# Patient Record
Sex: Female | Born: 1975 | Race: Black or African American | Hispanic: No | Marital: Single | Smoking: Never smoker
Health system: Southern US, Community
[De-identification: ages and names within clinical notes are randomized; demographics above are authoritative.]

## PROBLEM LIST (undated history)

## (undated) DIAGNOSIS — I10 Essential (primary) hypertension: Secondary | ICD-10-CM

## (undated) HISTORY — DX: Essential (primary) hypertension: I10

## (undated) HISTORY — PX: APPENDECTOMY: SHX54

## (undated) HISTORY — PX: CYST EXCISION: SHX5701

## (undated) HISTORY — PX: TONSILECTOMY/ADENOIDECTOMY WITH MYRINGOTOMY: SHX6125

## (undated) HISTORY — PX: ABDOMINAL HYSTERECTOMY: SHX81

---

## 2014-02-23 DIAGNOSIS — I10 Essential (primary) hypertension: Secondary | ICD-10-CM | POA: Insufficient documentation

## 2015-11-01 DIAGNOSIS — D500 Iron deficiency anemia secondary to blood loss (chronic): Secondary | ICD-10-CM | POA: Insufficient documentation

## 2019-09-04 ENCOUNTER — Other Ambulatory Visit: Payer: Self-pay

## 2019-09-04 ENCOUNTER — Ambulatory Visit (INDEPENDENT_AMBULATORY_CARE_PROVIDER_SITE_OTHER): Payer: 59 | Admitting: Bariatrics

## 2019-09-04 ENCOUNTER — Encounter (INDEPENDENT_AMBULATORY_CARE_PROVIDER_SITE_OTHER): Payer: Self-pay | Admitting: Bariatrics

## 2019-09-04 VITALS — BP 133/70 | HR 88 | Temp 98.4°F | Ht 65.0 in | Wt 276.0 lb

## 2019-09-04 DIAGNOSIS — I10 Essential (primary) hypertension: Secondary | ICD-10-CM

## 2019-09-04 DIAGNOSIS — Z9189 Other specified personal risk factors, not elsewhere classified: Secondary | ICD-10-CM

## 2019-09-04 DIAGNOSIS — R0602 Shortness of breath: Secondary | ICD-10-CM | POA: Diagnosis not present

## 2019-09-04 DIAGNOSIS — R5383 Other fatigue: Secondary | ICD-10-CM | POA: Diagnosis not present

## 2019-09-04 DIAGNOSIS — Z1331 Encounter for screening for depression: Secondary | ICD-10-CM | POA: Diagnosis not present

## 2019-09-04 DIAGNOSIS — Z6841 Body Mass Index (BMI) 40.0 and over, adult: Secondary | ICD-10-CM

## 2019-09-04 DIAGNOSIS — D508 Other iron deficiency anemias: Secondary | ICD-10-CM | POA: Diagnosis not present

## 2019-09-04 DIAGNOSIS — E66813 Obesity, class 3: Secondary | ICD-10-CM

## 2019-09-04 DIAGNOSIS — Z0289 Encounter for other administrative examinations: Secondary | ICD-10-CM

## 2019-09-04 NOTE — Progress Notes (Signed)
Chief Complaint:   OBESITY Katherine Arellano (MR# [id number]) is a 44 y.o. female who presents for evaluation and treatment of obesity and related comorbidities. Current BMI is Body mass index is 45.93 kg/m.Katherine Arellano has been struggling with her weight for many years and has been unsuccessful in either losing weight, maintaining weight loss, or reaching her healthy weight goal.  Katherine Arellano is currently in the action stage of change and ready to dedicate time achieving and maintaining a healthier weight. Katherine Arellano is interested in becoming our patient and working on intensive lifestyle modifications including (but not limited to) diet and exercise for weight loss.  Katherine Arellano does sometimes like to cook, but states that she gets into ruts. She craves baked goods. She skips breakfast most days.  Katherine Arellano's habits were reviewed today and are as follows: her desired weight loss is 76-96 lbs, she has been heavy most of her life, she started gaining weight when she started to travel, her heaviest weight ever was 300+ pounds, she craves baked goods, she snacks frequently in the evenings, she skips breakfast most days, she is frequently drinking liquids with calories, she frequently makes poor food choices, she frequently eats larger portions than normal, she has binge eating behaviors and she struggles with emotional eating.  Depression Screen Katherine Arellano's Food and Mood (modified PHQ-9) score was 7.  Depression screen [facility] 2/9 09/04/2019  Decreased Interest 0  Down, Depressed, Hopeless 1  PHQ - 2 Score 1  Altered sleeping 1  Tired, decreased energy 1  Change in appetite 1  Feeling bad or failure about yourself  1  Trouble concentrating 2  Moving slowly or fidgety/restless 0  Suicidal thoughts 0  PHQ-9 Score 7  Difficult doing work/chores Not difficult at all   Subjective:   Other fatigue. Katherine Arellano denies daytime somnolence and denies waking up still tired. Katherine Arellano generally gets 7-8 hours of sleep per night, and states  that she has generally restful sleep. Snoring is present. Apneic episodes are not present. Epworth Sleepiness Score is 2.  SOB (shortness of breath) on exertion. Katherine Arellano notes increasing shortness of breath with certain activities (occasionally stairs) and seems to be worsening over time with weight gain. She notes getting out of breath sooner with activity than she used to. This has not gotten worse recently. Katherine Arellano denies shortness of breath at rest or orthopnea.  Essential hypertension. Katherine Arellano is taking amlodipine. Blood pressure is well controlled.  BP Readings from Last 3 Encounters:  09/04/19 133/70   No results found for: CREATININE  Other iron deficiency anemia. Katherine Arellano is taking iron supplementation.  No flowsheet data found. No results found for: IRON, TIBC, FERRITIN No results found for: VITAMINB12  Depression screening. Katherine Arellano had a mildly positive depression screen with a PHQ-9 score of 7.  At risk for heart disease. Katherine Arellano is at a higher than average risk for cardiovascular disease due to obesity and hypertension. Reviewed: no chest pain on exertion, no dyspnea on exertion, and no swelling of ankles.  Assessment/Plan:   Other fatigue. Katherine Arellano does not feel that her weight is causing her energy to be lower than it should be. Fatigue may be related to obesity, depression or many other causes. Labs will be ordered, and in the meanwhile, [name] will focus on self care including making healthy food choices, increasing physical activity and focusing on stress reduction. EKG 12-Lead, Comprehensive metabolic panel, CBC with Differential/Platelet, Hemoglobin A1c, Insulin, random, Lipid Panel With LDL/HDL Ratio, VITAMIN D 25 Hydroxy (Vit-D Deficiency, Fractures),  TSH, T4, free, T3 labs ordered.  SOB (shortness of breath) on exertion. Katherine Arellano does not feel that she gets out of breath more easily that she used to when she exercises. Katherine Arellano's shortness of breath appears to be obesity related and exercise  induced. She has agreed to work on weight loss and gradually increase exercise to treat her exercise induced shortness of breath. Will continue to monitor closely. Comprehensive metabolic panel, CBC with Differential/Platelet, Hemoglobin A1c, Insulin, random, Lipid Panel With LDL/HDL Ratio, VITAMIN D 25 Hydroxy (Vit-D Deficiency, Fractures), TSH, T4, free, T3 labs ordered.  Essential hypertension. Katherine Arellano is working on healthy weight loss and exercise to improve blood pressure control. We will watch for signs of hypotension as she continues her lifestyle modifications. She will continue her medications as directed.  Other iron deficiency anemia. Orders and follow up as documented in patient record. CBC with Differential/Platelet ordered.  Counseling . Iron is essential for our bodies to make red blood cells.  Reasons that someone may be deficient include: an iron-deficient diet (more likely in those following vegan or vegetarian diets), women with heavy menses, patients with GI disorders or poor absorption, patients that have had bariatric surgery, frequent blood donors, patients with cancer, and patients with heart disease.   Katherine Arellano An iron supplement has been recommended. This is found over-the-counter.  Katherine Arellano foods include dark leafy greens, red and white meats, eggs, seafood, and beans.   . Certain foods and drinks prevent your body from absorbing iron properly. Avoid eating these foods in the same meal as iron-rich foods or with iron supplements. These foods include: coffee, black tea, and red wine; milk, dairy products, and foods that are high in calcium; beans and soybeans; whole grains.  . Constipation can be a side effect of iron supplementation. Increased water and fiber intake are helpful. Water goal: > 2 liters/day. Fiber goal: > 25 grams/day.     Depression screening. Regnia had a positive depression screening. Depression is commonly associated with obesity and often results in emotional  eating behaviors. We will monitor this closely and work on CBT to help improve the non-hunger eating patterns. Referral to Psychology may be required if no improvement is seen as she continues in our clinic.  At risk for heart disease. Myrtis was given approximately 15 minutes of coronary artery disease prevention counseling today. She is 44 y.o. female and has risk factors for heart disease including obesity. We discussed intensive lifestyle modifications today with an emphasis on specific weight loss instructions and strategies.   Repetitive spaced learning was employed today to elicit superior memory formation and behavioral change.  Class 3 severe obesity with serious comorbidity and body mass index (BMI) of 45.0 to 49.9 in adult, unspecified obesity type ([place]).  Shenai is currently in the action stage of change and her goal is to continue with weight loss efforts. I recommend Jaqueline begin the structured treatment plan as follows:  She has agreed to the Category 3 Plan.  She will work on meal planning, cut out sugary drinks, and decrease portion sizes.  Exercise goals: All adults should avoid inactivity. Some physical activity is better than none, and adults who participate in any amount of physical activity gain some health benefits.   Behavioral modification strategies: increasing lean protein intake, decreasing simple carbohydrates, increasing vegetables, increasing water intake, decreasing eating out, no skipping meals, meal planning and cooking strategies, keeping healthy foods in the home and planning for success.  She was informed of the importance  of frequent follow-up visits to maximize her success with intensive lifestyle modifications for her multiple health conditions. She was informed we would discuss her lab results at her next visit unless there is a critical issue that needs to be addressed sooner. Vienne agreed to keep her next visit at the agreed upon time to discuss these  results.  Objective:   Blood pressure 133/70, pulse 88, temperature 98.4 F (36.9 C), height 5\' 5"  (1.651 m), weight 276 lb (125.2 kg), last menstrual period 09/03/2019, SpO2 100 %. Body mass index is 45.93 kg/m.  EKG: Normal sinus rhythm, rate 82 BPM.  Indirect Calorimeter completed today shows a VO2 of 262 and a REE of 1826.  Her calculated basal metabolic rate is [id number] thus her basal metabolic rate is worse than expected.  General: Cooperative, alert, well developed, in no acute distress. HEENT: Conjunctivae and lids unremarkable. Cardiovascular: Regular rhythm.  Lungs: Normal work of breathing. Neurologic: No focal deficits.   No results found for: CREATININE, BUN, NA, K, CL, CO2 No results found for: ALT, AST, GGT, ALKPHOS, BILITOT No results found for: HGBA1C No results found for: INSULIN No results found for: TSH No results found for: CHOL, HDL, LDLCALC, LDLDIRECT, TRIG, CHOLHDL No results found for: WBC, HGB, HCT, MCV, PLT No results found for: IRON, TIBC, FERRITIN  Attestation Statements:   Reviewed by clinician on day of visit: allergies, medications, problem list, medical history, surgical history, family history, social history, and previous encounter notes.  [name], am acting as Location manager for [employer], DO   I have reviewed the above documentation for accuracy and completeness, and I agree with the above. Jearld Lesch, DO

## 2019-09-05 LAB — COMPREHENSIVE METABOLIC PANEL
ALT: 10 IU/L (ref 0–32)
AST: 16 IU/L (ref 0–40)
Albumin/Globulin Ratio: 1.6 (ref 1.2–2.2)
Albumin: 4.4 g/dL (ref 3.8–4.8)
Alkaline Phosphatase: 53 IU/L (ref 39–117)
BUN/Creatinine Ratio: 10 (ref 9–23)
BUN: 9 mg/dL (ref 6–24)
Bilirubin Total: 0.3 mg/dL (ref 0.0–1.2)
CO2: 26 mmol/L (ref 20–29)
Calcium: 9 mg/dL (ref 8.7–10.2)
Chloride: 102 mmol/L (ref 96–106)
Creatinine, Ser: 0.94 mg/dL (ref 0.57–1.00)
GFR calc Af Amer: 85 mL/min/{1.73_m2} (ref >59)
GFR calc non Af Amer: 74 mL/min/{1.73_m2} (ref >59)
Globulin, Total: 2.8 g/dL (ref 1.5–4.5)
Glucose: 79 mg/dL (ref 65–99)
Potassium: 4 mmol/L (ref 3.5–5.2)
Sodium: 139 mmol/L (ref 134–144)
Total Protein: 7.2 g/dL (ref 6.0–8.5)

## 2019-09-05 LAB — CBC WITH DIFFERENTIAL/PLATELET
Basophils Absolute: 0.1 10*3/uL (ref 0.0–0.2)
Basos: 2 %
EOS (ABSOLUTE): 0.1 10*3/uL (ref 0.0–0.4)
Eos: 1 %
Hematocrit: 37.6 % (ref 34.0–46.6)
Hemoglobin: 12.8 g/dL (ref 11.1–15.9)
Immature Grans (Abs): 0 10*3/uL (ref 0.0–0.1)
Immature Granulocytes: 0 %
Lymphocytes Absolute: 1.5 10*3/uL (ref 0.7–3.1)
Lymphs: 30 %
MCH: 30.3 pg (ref 26.6–33.0)
MCHC: 34 g/dL (ref 31.5–35.7)
MCV: 89 fL (ref 79–97)
Monocytes Absolute: 0.3 10*3/uL (ref 0.1–0.9)
Monocytes: 7 %
Neutrophils Absolute: 3 10*3/uL (ref 1.4–7.0)
Neutrophils: 60 %
Platelets: 353 10*3/uL (ref 150–450)
RBC: 4.22 x10E6/uL (ref 3.77–5.28)
RDW: 11.9 % (ref 11.7–15.4)
WBC: 4.9 10*3/uL (ref 3.4–10.8)

## 2019-09-05 LAB — LIPID PANEL WITH LDL/HDL RATIO
Cholesterol, Total: 169 mg/dL (ref 100–199)
HDL: 45 mg/dL (ref >39)
LDL Chol Calc (NIH): 112 mg/dL — ABNORMAL HIGH (ref 0–99)
LDL/HDL Ratio: 2.5 ratio (ref 0.0–3.2)
Triglycerides: 61 mg/dL (ref 0–149)
VLDL Cholesterol Cal: 12 mg/dL (ref 5–40)

## 2019-09-05 LAB — VITAMIN D 25 HYDROXY (VIT D DEFICIENCY, FRACTURES): Vit D, 25-Hydroxy: 30.4 ng/mL (ref 30.0–100.0)

## 2019-09-05 LAB — T3: T3, Total: 116 ng/dL (ref 71–180)

## 2019-09-05 LAB — T4, FREE: Free T4: 1.43 ng/dL (ref 0.82–1.77)

## 2019-09-05 LAB — INSULIN, RANDOM: INSULIN: 7.3 u[IU]/mL (ref 2.6–24.9)

## 2019-09-05 LAB — HEMOGLOBIN A1C
Est. average glucose Bld gHb Est-mCnc: 85 mg/dL
Hgb A1c MFr Bld: 4.6 % — ABNORMAL LOW (ref 4.8–5.6)

## 2019-09-05 LAB — TSH: TSH: 1.08 u[IU]/mL (ref 0.450–4.500)

## 2019-09-18 ENCOUNTER — Encounter (INDEPENDENT_AMBULATORY_CARE_PROVIDER_SITE_OTHER): Payer: Self-pay | Admitting: Bariatrics

## 2019-09-18 ENCOUNTER — Ambulatory Visit (INDEPENDENT_AMBULATORY_CARE_PROVIDER_SITE_OTHER): Payer: 59 | Admitting: Bariatrics

## 2019-09-18 ENCOUNTER — Other Ambulatory Visit: Payer: Self-pay

## 2019-09-18 VITALS — BP 126/57 | HR 104 | Temp 98.7°F | Ht 65.0 in | Wt 273.0 lb

## 2019-09-18 DIAGNOSIS — I10 Essential (primary) hypertension: Secondary | ICD-10-CM | POA: Diagnosis not present

## 2019-09-18 DIAGNOSIS — F3289 Other specified depressive episodes: Secondary | ICD-10-CM

## 2019-09-18 DIAGNOSIS — Z9189 Other specified personal risk factors, not elsewhere classified: Secondary | ICD-10-CM | POA: Diagnosis not present

## 2019-09-18 DIAGNOSIS — E559 Vitamin D deficiency, unspecified: Secondary | ICD-10-CM | POA: Diagnosis not present

## 2019-09-18 DIAGNOSIS — Z6841 Body Mass Index (BMI) 40.0 and over, adult: Secondary | ICD-10-CM

## 2019-09-18 MED ORDER — VITAMIN D (ERGOCALCIFEROL) 1.25 MG (50000 UNIT) PO CAPS: 50000.0000 [IU] | ORAL_CAPSULE | ORAL | 0 refills | Status: DC

## 2019-09-18 NOTE — Progress Notes (Signed)
Office: [phone]  /  Fax: [phone]    Date: September 28, 2019   Appointment Start Time: 3:00pm Duration: 40 minutes Provider: Glennie Isle, Psy.D. Type of Session: Intake for Individual Therapy  Location of Patient: Home Location of Provider: Provider's Home Type of Contact: Telepsychological Visit via [employer]  Informed Consent: Prior to proceeding with today's appointment, two pieces of identifying information were obtained. In addition, Jasnoor's physical location at the time of this appointment was obtained as well a phone number she could be reached at in the event of technical difficulties. Diane and this provider participated in today's telepsychological service.   The provider's role was explained to [facility]. The provider reviewed and discussed issues of confidentiality, privacy, and limits therein (e.g., reporting obligations). In addition to verbal informed consent, written informed consent for psychological services was obtained prior to the initial appointment. Since the clinic is not a 24/7 crisis center, mental health emergency resources were shared and this  provider explained MyChart, e-mail, voicemail, and/or other messaging systems should be utilized only for non-emergency reasons. This provider also explained that information obtained during appointments will be placed in Avriel's medical record and relevant information will be shared with other providers at [facility] for coordination of care. Moreover, Laisha agreed information may be shared with other [facility] providers as needed for coordination of care. By signing the service agreement document, Destyn provided written consent for coordination of care. Prior to initiating telepsychological services, Shanae completed an informed consent document, which included the development of a safety plan (i.e., an emergency contact, nearest emergency room, and emergency resources) in the event of an  emergency/crisis. Labrea expressed understanding of the rationale of the safety plan. Stephie verbally acknowledged understanding she is ultimately responsible for understanding her insurance benefits for telepsychological and in-person services. This provider also reviewed confidentiality, as it relates to telepsychological services, as well as the rationale for telepsychological services (i.e., to reduce exposure risk to COVID-19). Tajai  acknowledged understanding that appointments cannot be recorded without both party consent and she is aware she is responsible for securing confidentiality on her end of the session. Takayla verbally consented to proceed.  Chief Complaint/HPI: Ileen was referred by Dr. Jearld Lesch due to other depression, with emotional eating. Per the note for the visit with Dr. Jearld Lesch on September 18, 2019, "Kryslyn is struggling with emotional eating and using food for comfort to the extent that it is negatively impacting her health. She has been working on behavior modification techniques to help reduce her emotional eating and has been somewhat successful. She shows no sign of suicidal or homicidal ideations. Annalie reports more grazing and reward eating." The note for the initial appointment with Dr. Jearld Lesch (September 04, 2019) indicated the following: "Perla's habits were reviewed today and are as follows: her desired weight loss is 76-96 lbs, she has been heavy most of her life, she started gaining weight when she started to travel, her heaviest weight ever was 300+ pounds, she craves baked goods, she snacks frequently in the evenings, she skips breakfast most days, she is frequently drinking liquids with calories, she frequently makes poor food choices, she frequently eats larger portions than normal, she has binge eating behaviors and she struggles with emotional eating." Malaney's Food and Mood (modified PHQ-9) score on September 04, 2019 was 7.  During today's appointment, Khamryn was verbally  administered a questionnaire assessing various behaviors related to emotional eating. Kensli endorsed the following:  overeat when you are celebrating, experience food cravings on a regular basis, eat certain foods when you are anxious, stressed, depressed, or your feelings are hurt, find food is comforting to you, overeat when you are worried about something, overeat frequently when you are bored or lonely, overeat when you are alone, but eat much less when you are with other people and eat as a reward. She shared she craves baked goods and homemade foods. Karter believes the onset of emotional eating was likely during childhood and described the current frequency of emotional eating as "few times a month." She added, "I've gotten better as I've aged." Ciarah further shared she engages in mindless eating. In addition, Adora denied a history of binge eating. However, she described engaging in grazing behaviors. Brenee denied a history of restricting food intake, purging and engagement in other compensatory strategies, and has never been diagnosed with an eating disorder. She also denied a history of treatment for emotional eating. Moreover, Jaqueline indicated stress and a desire to reward self due to stress triggers emotional eating, whereas staying busy and occupied makes emotional eating better. Furthermore, Sharlyne reported ongoing work stressors, noting she worries if she will be let go.    Mental Status Examination:  Appearance: well groomed and appropriate hygiene  Behavior: appropriate to circumstances Mood: euthymic Affect: mood congruent Speech: normal in rate, volume, and tone Eye Contact: appropriate Psychomotor Activity: appropriate Gait: unable to assess Thought Process: linear, logical, and goal directed  Thought Content/Perception: denies suicidal and homicidal ideation, plan, and intent and no hallucinations, delusions, bizarre thinking or behavior reported or observed Orientation: time, person,  place and purpose of appointment Memory/Concentration: memory, attention, language, and fund of knowledge intact  Insight/Judgment: good  Family & Psychosocial History: Roseanna reported she is not in a relationship and she does not have any children. 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 Additionally, Marvette shared her highest level of education obtained is a bachelor's degree. Currently, Charne's social support system consists of her best friend and parents. Moreover, Iyani stated she resides alone.  Medical History:  Past Medical History:  Diagnosis Date  . High blood pressure    Past Surgical History:  Procedure Laterality Date  . TONSILECTOMY/ADENOIDECTOMY WITH MYRINGOTOMY     Current Outpatient Medications on File Prior to Visit  Medication Sig Dispense Refill  . amLODipine (NORVASC) 10 MG tablet Take 10 mg by mouth daily.    . Biotin 1 MG CAPS Take by mouth.    . ferrous sulfate 325 (65 FE) MG tablet Take 325 mg by mouth daily with breakfast.    . Multiple Vitamin (MULTI-VITAMIN DAILY PO) Take by mouth.    . Vitamin D, Ergocalciferol, (DRISDOL) 1.25 MG (50000 UNIT) CAPS capsule Take 1 capsule (50,000 Units total) by mouth every 7 (seven) days. 4 capsule 0   No current facility-administered medications on file prior to visit.  Arya denied a history of head injuries and loss of consciousness.    Mental Health History: Edia reported there is no history of therapeutic services. Calea reported there is no history of hospitalizations for psychiatric concerns, and she has never met with a psychiatrist. Jericho stated she has never been prescribed psychotropic medications. Kaedyn denied a family history of mental health related concerns. Kamarii reported there is no history of trauma including psychological, physical  and sexual abuse, as well as neglect.   Mio described her typical mood lately as "okay." Aside from concerns noted above and endorsed  on the PHQ-9 and  GAD-7, Glenn reported experiencing worry about her father's health and work as well as crying spells. Maizee endorsed occasional social alcohol use.  She denied tobacco use. She denied illicit/recreational substance use. Regarding caffeine intake, Melonie reported consuming 3-4 cups of coffee daily. Furthermore, Thersia indicated she is not experiencing the following: hallucinations and delusions, paranoia, symptoms of mania , social withdrawal, panic attacks and decreased motivation. She also denied current suicidal ideation, plan, and intent; history of and current homicidal ideation, plan, and intent; and history of and current engagement in self-harm.  Jaylean reported a history of suicidal ideation (e.g., I wish I wasn't here.) secondary to bullying in 7th grade. She denied a history of suicidal plan and intent. Shamyah reported she last experienced suicidal ideation around junior year of high school. The following protective factors were identified for Marlia: travel, experience every day, and desire for new experiences. If she were to become overwhelmed in the future, which is a sign that a crisis may occur, she identified the following coping skills she could engage in: remove self from situation, read, watch a movie, and talk to friends. It was recommended the aforementioned be written down and developed into a coping card for future reference; she was observed writing. Psychoeducation regarding the importance of reaching out to a trusted individual and/or utilizing emergency resources if there is a change in emotional status and/or there is an inability to ensure safety was provided. Lyndell's confidence in reaching out to a trusted individual and/or utilizing emergency resources should there be an intensification in emotional status and/or there is an inability to ensure safety was assessed on a scale of one to ten where one is not confident and ten is extremely confident. She reported her confidence is a 10.  Additionally, Marliss reported she has access to a pistol, noting it is locked. She was agreeable to giving her pistol to a trusted individual should there ever be any safety concerns.  The following strengths were reported by [facility]: patient with others, empathy, and willingness to listen to others. The following strengths were observed by this provider: ability to express thoughts and feelings during the therapeutic session, ability to establish and benefit from a therapeutic relationship, willingness to work toward established goal(s) with the clinic and ability to engage in reciprocal conversation.  Legal History: Amaziah reported there is no history of legal involvement.   Structured Assessments Results: The Patient Health Questionnaire-9 (PHQ-9) is a self-report measure that assesses symptoms and severity of depression over the course of the last two weeks. Cydney obtained a score of 3 suggesting minimal depression. Clydine finds the endorsed symptoms to be not difficult at all. [0= Not at all; 1= Several days; 2= More than half the days; 3= Nearly every day] Little interest or pleasure in doing things 0  Feeling down, depressed, or hopeless 0  Trouble falling or staying asleep, or sleeping too much 0  Feeling tired or having little energy 2  Poor appetite or overeating 1  Feeling bad about yourself --- or that you are a failure or have let yourself or your family down 0  Trouble concentrating on things, such as reading the newspaper or watching television 0  Moving or speaking so slowly that other people could have noticed? Or the opposite --- being so fidgety or restless that you have been moving around a lot more than usual 0  Thoughts that you would be better off dead or hurting yourself in some way  0  PHQ-9 Score 3    The Generalized Anxiety Disorder-7 (GAD-7) is a brief self-report measure that assesses symptoms of anxiety over the course of the last two weeks. Aleathia obtained a score of 4  suggesting minimal anxiety. Delpha finds the endorsed symptoms to be somewhat difficult. [0= Not at all; 1= Several days; 2= Over half the days; 3= Nearly every day] Feeling nervous, anxious, on edge 1  Not being able to stop or control worrying 1  Worrying too much about different things 0  Trouble relaxing 0  Being so restless that it's hard to sit still 0  Becoming easily annoyed or irritable 0  Feeling afraid as if something awful might happen 2  GAD-7 Score 4   Interventions:  Conducted a chart review Focused on rapport building Verbally administered PHQ-9 and GAD-7 for symptom monitoring Verbally administered Food & Mood questionnaire to assess various behaviors related to emotional eating. Provided emphatic reflections and validation Collaborated with patient on a treatment goal  Psychoeducation provided regarding physical versus emotional hunger Conducted a risk assessment Developed a coping card  Provisional DSM-5 Diagnosis(es): 307.59 (F50.8) Other Specified Feeding or Eating Disorder, Emotional Eating Behaviors  Plan: Karna appears able and willing to participate as evidenced by collaboration on a treatment goal, engagement in reciprocal conversation, and asking questions as needed for clarification. Based on appointment availability and Rudene's work schedule, the next appointment will be scheduled in 2-3 weeks, which will be via [place] Visit. The following treatment goal was established: increase coping skills. This provider will regularly review the treatment plan and medical chart to keep informed of status changes. Laquanta expressed understanding and agreement with the initial treatment plan of care. Cumi will be sent a handout via e-mail to utilize between now and the next appointment to increase awareness of hunger patterns and subsequent eating. Adianna provided verbal consent during today's appointment for this provider to send the handout via e-mail.

## 2019-09-19 ENCOUNTER — Encounter (INDEPENDENT_AMBULATORY_CARE_PROVIDER_SITE_OTHER): Payer: Self-pay | Admitting: Bariatrics

## 2019-09-19 NOTE — Progress Notes (Signed)
Chief Complaint:   OBESITY Katherine Arellano is here to discuss her progress with her obesity treatment plan along with follow-up of her obesity related diagnoses. Katherine Arellano is on the Category 3 Plan and states she is following her eating plan approximately 80% of the time. Katherine Arellano states she is doing cardio/strengthening 30 minutes 3 times per week and walking 10,000 steps 6 times per week.  Today's visit was #: 2 Starting weight: 276 lbs Starting date: 09/04/2019 Today's weight: 273 lbs Today's date: 09/18/2019 Total lbs lost to date: 3 Total lbs lost since last in-office visit: 3  Interim History: Katherine Arellano is down 3 lbs from her last visit. She has been under more stress with her father being sick. She has stopped drinking soda.  Subjective:   Vitamin D deficiency. Katherine Arellano is not on Vitamin D supplement. Last Vitamin D 30.4 on 09/04/2019.  Essential hypertension. Katherine Arellano is taking Norvasc. Blood pressure is well controlled.  BP Readings from Last 3 Encounters:  09/18/19 (!) 126/57  09/04/19 133/70   Lab Results  Component Value Date   CREATININE 0.94 09/04/2019   Other depression, with emotional eating. Katherine Arellano is struggling with emotional eating and using food for comfort to the extent that it is negatively impacting her health. She has been working on behavior modification techniques to help reduce her emotional eating and has been somewhat successful. She shows no sign of suicidal or homicidal ideations. Katherine Arellano reports more grazing and reward eating.  At risk for osteoporosis. Katherine Arellano is at higher risk of osteopenia and osteoporosis due to Vitamin D deficiency.    Assessment/Plan:   Vitamin D deficiency. Low Vitamin D level contributes to fatigue and are associated with obesity, breast, and colon cancer. She was given a prescription for Vitamin D, Ergocalciferol, (DRISDOL) 1.25 MG (50000 UNIT) CAPS capsule every week #4 with 0 refills and will follow-up for routine testing of Vitamin D, at  least 2-3 times per year to avoid over-replacement.    Essential hypertension. Katherine Arellano is working on healthy weight loss and exercise to improve blood pressure control. We will watch for signs of hypotension as she continues her lifestyle modifications. She will continue medications as directed.  Other depression, with emotional eating. Behavior modification techniques were discussed today to help Katherine Arellano deal with her emotional/non-hunger eating behaviors.  Orders and follow up as documented in patient record. Will refer to Dr. Mallie Mussel, our bariatric psychologist, for evaluation.  At risk for osteoporosis. Katherine Arellano was given approximately 15 minutes of osteoporosis prevention counseling today. Katherine Arellano is at risk for osteopenia and osteoporosis due to her Vitamin D deficiency. She was encouraged to take her Vitamin D and follow her higher calcium diet and increase strengthening exercise to help strengthen her bones and decrease her risk of osteopenia and osteoporosis.  Repetitive spaced learning was employed today to elicit superior memory formation and behavioral change.  Class 3 severe obesity with serious comorbidity and body mass index (BMI) of 45.0 to 49.9 in adult, unspecified obesity type (Katherine Arellano).  Katherine Arellano is currently in the action stage of change. As such, her goal is to continue with weight loss efforts. She has agreed to the Category 3 Plan.   She will work on meal planning. Handout was given on "Eating Out."  We reviewed labs with the patient including CMP, lipids, Vitamin D, CBC, A1c, insulin, and thyroid panel.  Exercise goals: For substantial health benefits, adults should do at least 150 minutes (2 hours and 30 minutes) a week of moderate-intensity,  or 75 minutes (1 hour and 15 minutes) a week of vigorous-intensity aerobic physical activity, or an equivalent combination of moderate- and vigorous-intensity aerobic activity. Aerobic activity should be performed in episodes of at least 10 minutes,  and preferably, it should be spread throughout the week.  Behavioral modification strategies: increasing lean protein intake, decreasing simple carbohydrates, increasing vegetables, increasing water intake, decreasing eating out, no skipping meals, meal planning and cooking strategies, keeping healthy foods in the home, ways to avoid boredom eating, ways to avoid night time snacking, better snacking choices, emotional eating strategies and planning for success.  Katherine Arellano has agreed to follow-up with our clinic in 2 weeks. She was informed of the importance of frequent follow-up visits to maximize her success with intensive lifestyle modifications for her multiple health conditions.   Objective:   Blood pressure (!) 126/57, pulse (!) 104, temperature 98.7 F (37.1 C), height 5\' 5"  (1.651 m), weight 273 lb (123.8 kg), last menstrual period 09/03/2019, SpO2 99 %. Body mass index is 45.43 kg/m.  General: Cooperative, alert, well developed, in no acute distress. HEENT: Conjunctivae and lids unremarkable. Cardiovascular: Regular rhythm.  Lungs: Normal work of breathing. Neurologic: No focal deficits.   Lab Results  Component Value Date   CREATININE 0.94 09/04/2019   BUN 9 09/04/2019   NA 139 09/04/2019   K 4.0 09/04/2019   CL 102 09/04/2019   CO2 26 09/04/2019   Lab Results  Component Value Date   ALT 10 09/04/2019   AST 16 09/04/2019   ALKPHOS 53 09/04/2019   BILITOT 0.3 09/04/2019   Lab Results  Component Value Date   HGBA1C 4.6 (L) 09/04/2019   Lab Results  Component Value Date   INSULIN 7.3 09/04/2019   Lab Results  Component Value Date   TSH 1.080 09/04/2019   Lab Results  Component Value Date   CHOL 169 09/04/2019   HDL 45 09/04/2019   LDLCALC 112 (H) 09/04/2019   TRIG 61 09/04/2019   Lab Results  Component Value Date   WBC 4.9 09/04/2019   HGB 12.8 09/04/2019   HCT 37.6 09/04/2019   MCV 89 09/04/2019   PLT 353 09/04/2019   No results found for: IRON, TIBC,  FERRITIN  Attestation Statements:   Reviewed by clinician on day of visit: allergies, medications, problem list, medical history, surgical history, family history, social history, and previous encounter notes.  [name], am acting as Location manager for [employer], DO   I have reviewed the above documentation for accuracy and completeness, and I agree with the above. Jearld Lesch, DO

## 2019-09-28 ENCOUNTER — Other Ambulatory Visit: Payer: Self-pay

## 2019-09-28 ENCOUNTER — Ambulatory Visit (INDEPENDENT_AMBULATORY_CARE_PROVIDER_SITE_OTHER): Payer: 59 | Admitting: Psychology

## 2019-09-28 DIAGNOSIS — F5089 Other specified eating disorder: Secondary | ICD-10-CM | POA: Diagnosis not present

## 2019-10-03 ENCOUNTER — Ambulatory Visit (INDEPENDENT_AMBULATORY_CARE_PROVIDER_SITE_OTHER): Payer: 59 | Admitting: Bariatrics

## 2019-10-04 NOTE — Progress Notes (Signed)
Entered in error

## 2019-10-11 ENCOUNTER — Other Ambulatory Visit: Payer: Self-pay

## 2019-10-11 ENCOUNTER — Ambulatory Visit (INDEPENDENT_AMBULATORY_CARE_PROVIDER_SITE_OTHER): Payer: 59 | Admitting: Bariatrics

## 2019-10-11 ENCOUNTER — Encounter (INDEPENDENT_AMBULATORY_CARE_PROVIDER_SITE_OTHER): Payer: Self-pay | Admitting: Bariatrics

## 2019-10-11 VITALS — BP 127/81 | HR 83 | Temp 98.2°F | Ht 65.0 in | Wt 273.0 lb

## 2019-10-11 DIAGNOSIS — F3289 Other specified depressive episodes: Secondary | ICD-10-CM | POA: Diagnosis not present

## 2019-10-11 DIAGNOSIS — E559 Vitamin D deficiency, unspecified: Secondary | ICD-10-CM | POA: Diagnosis not present

## 2019-10-11 DIAGNOSIS — Z6841 Body Mass Index (BMI) 40.0 and over, adult: Secondary | ICD-10-CM

## 2019-10-11 DIAGNOSIS — I10 Essential (primary) hypertension: Secondary | ICD-10-CM | POA: Diagnosis not present

## 2019-10-11 MED ORDER — VITAMIN D (ERGOCALCIFEROL) 1.25 MG (50000 UNIT) PO CAPS: 50000.0000 [IU] | ORAL_CAPSULE | ORAL | 0 refills | Status: DC

## 2019-10-11 NOTE — Progress Notes (Signed)
Chief Complaint:   OBESITY Katherine Arellano is here to discuss her progress with her obesity treatment plan along with follow-up of her obesity related diagnoses. [name] is on the Category 3 Plan and states she is following her eating plan approximately 30% of the time. [name] states she is walking 30 minutes 1 time per week.  Today's visit was #: 3 Starting weight: 276 lbs Starting date: 09/04/2019 Today's weight: 273 lbs Today's date: 10/11/2019 Total lbs lost to date: 3 Total lbs lost since last in-office visit: 0  Interim History: Katherine Arellano has been traveling for work the last two weeks to [place], [place]. She ate out more frequently and was unable to exercise at her typical level while traveling. She has an upcoming trip to visit her parents and is planning on having food delivered to her parents' house to help stay on plan.  Subjective:   Vitamin D deficiency. Last Vitamin D 30.4 on 09/04/2019. Katherine Arellano is on prescription strength Vitamin D supplementation.  Essential hypertension. Blood pressure is well controlled at today's office visit. She is currently on amlodipine 10 mg daily. She denies lower extremity edema.  BP Readings from Last 3 Encounters:  10/11/19 127/81  09/18/19 (!) 126/57  09/04/19 133/70   Lab Results  Component Value Date   CREATININE 0.94 09/04/2019   Other depression, with emotional eating. Andrea is struggling with emotional eating and using food for comfort to the extent that it is negatively impacting her health. She has been working on behavior modification techniques to help reduce her emotional eating and has been somewhat successful. She shows no sign of suicidal or homicidal ideations. Katherine Arellano has been seeing Dr. Mallie Mussel and discussing strategies to identify true hunger versus emotional eating/reward eating.  Assessment/Plan:   Vitamin D deficiency. Low Vitamin D level contributes to fatigue and are associated with obesity, breast, and colon cancer. She  was given a refill on her Vitamin D, Ergocalciferol, (DRISDOL) 1.25 MG (50000 UNIT) CAPS capsule every week #4 with 0 refills and will follow-up for routine testing of Vitamin D, at least 2-3 times per year to avoid over-replacement.     Essential hypertension. Katherine Arellano is working on healthy weight loss and exercise to improve blood pressure control. We will watch for signs of hypotension as she continues her lifestyle modifications. [name] will continue her current CCB and will continue the Category 3 meal plan.  Other depression, with emotional eating. Behavior modification techniques were discussed today to help Katherine Arellano deal with her emotional/non-hunger eating behaviors.  Orders and follow up as documented in patient record. [name] will continue with Dr. Mallie Mussel as directed.  Class 3 severe obesity with serious comorbidity and body mass index (BMI) of 45.0 to 49.9 in adult, unspecified obesity type ([place]).  Katherine Arellano is currently in the action stage of change. As such, her goal is to continue with weight loss efforts. She has agreed to the Category 3 Plan. We provided protein equivalence information sheet.  Exercise goals: Katherine Arellano will continue her current exercise regimen.  Behavioral modification strategies: increasing lean protein intake, increasing water intake, no skipping meals and travel eating strategies.  Katherine Arellano has agreed to follow-up with our clinic in 4 weeks. She was informed of the importance of frequent follow-up visits to maximize her success with intensive lifestyle modifications for her multiple health conditions.   Objective:   Blood pressure 127/81, pulse 83, temperature 98.2 F (36.8 C), height 5\' 5"  (1.651 m), weight 273 lb (123.8 kg), last menstrual  period 10/02/2019, SpO2 100 %. Body mass index is 45.43 kg/m.  General: Cooperative, alert, well developed, in no acute distress. HEENT: Conjunctivae and lids unremarkable. Cardiovascular: Regular rhythm.  Lungs: Normal work of  breathing. Neurologic: No focal deficits.   Lab Results  Component Value Date   CREATININE 0.94 09/04/2019   BUN 9 09/04/2019   NA 139 09/04/2019   K 4.0 09/04/2019   CL 102 09/04/2019   CO2 26 09/04/2019   Lab Results  Component Value Date   ALT 10 09/04/2019   AST 16 09/04/2019   ALKPHOS 53 09/04/2019   BILITOT 0.3 09/04/2019   Lab Results  Component Value Date   HGBA1C 4.6 (L) 09/04/2019   Lab Results  Component Value Date   INSULIN 7.3 09/04/2019   Lab Results  Component Value Date   TSH 1.080 09/04/2019   Lab Results  Component Value Date   CHOL 169 09/04/2019   HDL 45 09/04/2019   LDLCALC 112 (H) 09/04/2019   TRIG 61 09/04/2019   Lab Results  Component Value Date   WBC 4.9 09/04/2019   HGB 12.8 09/04/2019   HCT 37.6 09/04/2019   MCV 89 09/04/2019   PLT 353 09/04/2019   No results found for: IRON, TIBC, FERRITIN  Attestation Statements:   Reviewed by clinician on day of visit: allergies, medications, problem list, medical history, surgical history, family history, social history, and previous encounter notes.  [name], am acting as Location manager for [employer], DO   I have reviewed the above documentation for accuracy and completeness, and I agree with the above. Jearld Lesch, DO

## 2019-10-18 ENCOUNTER — Other Ambulatory Visit: Payer: Self-pay

## 2019-10-18 ENCOUNTER — Encounter (INDEPENDENT_AMBULATORY_CARE_PROVIDER_SITE_OTHER): Payer: 59 | Admitting: Psychology

## 2019-10-18 ENCOUNTER — Telehealth (INDEPENDENT_AMBULATORY_CARE_PROVIDER_SITE_OTHER): Payer: Self-pay | Admitting: Psychology

## 2019-10-18 ENCOUNTER — Encounter (INDEPENDENT_AMBULATORY_CARE_PROVIDER_SITE_OTHER): Payer: Self-pay

## 2019-10-18 NOTE — Telephone Encounter (Addendum)
  Office: [phone]  /  Fax: [phone]  Date of Encounter: October 18, 2019  Time of Encounter: 8:00am Duration of Encounter: ~4 minutes Provider: Glennie Isle, PsyD  CONTENT: Dara presented on time for today's MyChart Video Visit; however, she noted she was in [place] visiting her parents. This provider is not licensed outside of [place]; therefore, today's appointment was rescheduled. Sia was understanding and shared everything has been going well. No evidence of suicidal and homicidal ideation, plan, or intent. She denied any other concerns at this time.   PLAN: Mittie is scheduled for an appointment on Oct 31, 2019 at 9:00am.

## 2019-10-31 ENCOUNTER — Telehealth (INDEPENDENT_AMBULATORY_CARE_PROVIDER_SITE_OTHER): Payer: 59 | Admitting: Psychology

## 2019-10-31 DIAGNOSIS — F5089 Other specified eating disorder: Secondary | ICD-10-CM

## 2019-10-31 NOTE — Progress Notes (Signed)
  Office: [phone]  /  Fax: [phone]    Date: Oct 31, 2019    Appointment Start Time: 9:04am Duration: 29 minutes Provider: Glennie Isle, Psy.D. Type of Session: Individual Therapy  Location of Patient: Home Location of Provider: Provider's Home Type of Contact: Telepsychological Visit via MyChart Video Visit  Session Content: Katherine Arellano is a 44 y.o. female presenting via MyChart Video Visit for a follow-up appointment to address the previously established treatment goal of increasing coping skills. Today's appointment was a telepsychological visit due to COVID-19. Katherine Arellano provided verbal consent for today's telepsychological appointment and she is aware she is responsible for securing confidentiality on her end of the session. Prior to proceeding with today's appointment, Katherine Arellano's physical location at the time of this appointment was obtained as well a phone number she could be reached at in the event of technical difficulties. Katherine Arellano and this provider participated in today's telepsychological service.   Katherine Arellano acknowledged understanding that for today's appointment and future telepsychological appointments, MyChart Video Visit will be utilized. She also verbally acknowledged understanding that the information outlined in the telepsychological informed consent form signed at the onset of treatment would still be applicable despite the change in the videoconferencing platform.   This provider conducted a brief check-in. Katherine Arellano shared about recent stressors. She discussed eating "fairly well" while visiting her parents. Positive reinforcement was provided. Katherine Arellano discussed a reduction in mindless eating, noting she is trying different activities to help her cope. Psychoeducation regarding pleasurable activities, including its impact on emotional eating and overall well-being was provided. Katherine Arellano was provided with a handout with various options of pleasurable activities, and was encouraged to engage in one  activity a day and additional activities as needed when triggered to emotionally eat. Katherine Arellano agreed. Katherine Arellano provided verbal consent during today's appointment for this provider to send a handout with pleasurable activities via e-mail. Katherine Arellano was receptive to today's appointment as evidenced by openness to sharing, responsiveness to feedback, and willingness to engage in pleasurable activities to assist with coping.  Mental Status Examination:  Appearance: well groomed and appropriate hygiene  Behavior: appropriate to circumstances Mood: euthymic Affect: mood congruent Speech: normal in rate, volume, and tone Eye Contact: appropriate Psychomotor Activity: appropriate Gait: unable to assess Thought Process: linear, logical, and goal directed  Thought Content/Perception: no hallucinations, delusions, bizarre thinking or behavior reported or observed and no evidence of suicidal and homicidal ideation, plan, and intent Orientation: time, person, place and purpose of appointment Memory/Concentration: memory, attention, language, and fund of knowledge intact  Insight/Judgment: good  Interventions:  Conducted a brief chart review Provided empathic reflections and validation Employed supportive psychotherapy interventions to facilitate reduced distress and to improve coping skills with identified stressors Employed motivational interviewing skills to assess patient's willingness/desire to adhere to recommended medical treatments and assignments Psychoeducation provided regarding pleasurable activities  DSM-5 Diagnosis(es): 307.59 (F50.8) Other Specified Feeding or Eating Disorder, Emotional Eating Behaviors  Treatment Goal & Progress: During the initial appointment with this provider, the following treatment goal was established: increase coping skills. Katherine Arellano has demonstrated progress in her goal as evidenced by increased awareness of hunger patterns.   Plan: The next appointment will be scheduled in  two weeks, which will be via MyChart Video Visit. The next session will focus on working towards the established treatment goal.

## 2019-10-31 NOTE — Progress Notes (Signed)
  Office: [phone]  /  Fax: [phone]    Date: Nov 14, 2019   Appointment Start Time: 4:22pm Duration: 29 minutes Provider: Glennie Isle, Psy.D. Type of Session: Individual Therapy  Location of Patient: Home Location of Provider: Provider's Home Type of Contact: Telepsychological Visit via MyChart Video Visit  Session Content: Katherine Arellano is a 44 y.o. female presenting via MyChart Video Visit for a follow-up appointment to address the previously established treatment goal of increasing coping skills. Today's appointment was a telepsychological visit due to COVID-19. Katherine Arellano provided verbal consent for today's telepsychological appointment and she is aware she is responsible for securing confidentiality on her end of the session. Prior to proceeding with today's appointment, Katherine Arellano's physical location at the time of this appointment was obtained as well a phone number she could be reached at in the event of technical difficulties. Katherine Arellano and this provider participated in today's telepsychological service.   This provider conducted a brief check-in. Katherine Arellano discussed engaging in pleasurable activities as well as physical activity classes. She noted the aforementioned has helped reduce mindless eating. Moreover, psychoeducation regarding triggers for emotional eating was provided. Katherine Arellano was provided a handout, and encouraged to utilize the handout between now and the next appointment to increase awareness of triggers and frequency. Katherine Arellano agreed. This provider also discussed behavioral strategies for specific triggers, such as placing the utensil down when conversing to avoid mindless eating. Katherine Arellano provided verbal consent during today's appointment for this provider to send a handout about triggers via e-mail. Katherine Arellano was receptive to today's appointment as evidenced by openness to sharing, responsiveness to feedback, and willingness to explore triggers for emotional eating.  Mental Status Examination:    Appearance: well groomed and appropriate hygiene  Behavior: appropriate to circumstances Mood: euthymic Affect: mood congruent Speech: normal in rate, volume, and tone Eye Contact: appropriate Psychomotor Activity: appropriate Gait: unable to assess Thought Process: linear, logical, and goal directed  Thought Content/Perception: no hallucinations, delusions, bizarre thinking or behavior reported or observed and no evidence of suicidal and homicidal ideation, plan, and intent Orientation: time, person, place and purpose of appointment Memory/Concentration: memory, attention, language, and fund of knowledge intact  Insight/Judgment: good  Interventions:  Conducted a brief chart review Provided empathic reflections and validation Reviewed content from the previous session Employed supportive psychotherapy interventions to facilitate reduced distress and to improve coping skills with identified stressors Employed motivational interviewing skills to assess patient's willingness/desire to adhere to recommended medical treatments and assignments Psychoeducation provided regarding triggers for emotional eating  DSM-5 Diagnosis(es): 307.59 (F50.8) Other Specified Feeding or Eating Disorder, Emotional Eating Behaviors  Treatment Goal & Progress: During the initial appointment with this provider, the following treatment goal was established: increase coping skills. Katherine Arellano has demonstrated progress in her goal as evidenced by increased awareness of hunger patterns. Katherine Arellano also continues to demonstrate willingness to engage in learned skill(s).  Plan: The next appointment will be scheduled in approximately three weeks, which will be via MyChart Video Visit. The next session will focus on working towards the established treatment goal.

## 2019-11-08 ENCOUNTER — Ambulatory Visit (INDEPENDENT_AMBULATORY_CARE_PROVIDER_SITE_OTHER): Payer: 59 | Admitting: Bariatrics

## 2019-11-08 ENCOUNTER — Other Ambulatory Visit: Payer: Self-pay

## 2019-11-08 ENCOUNTER — Encounter (INDEPENDENT_AMBULATORY_CARE_PROVIDER_SITE_OTHER): Payer: Self-pay | Admitting: Bariatrics

## 2019-11-08 VITALS — BP 133/74 | HR 86 | Temp 97.7°F | Ht 65.0 in | Wt 271.0 lb

## 2019-11-08 DIAGNOSIS — E559 Vitamin D deficiency, unspecified: Secondary | ICD-10-CM

## 2019-11-08 DIAGNOSIS — I10 Essential (primary) hypertension: Secondary | ICD-10-CM

## 2019-11-08 DIAGNOSIS — Z9189 Other specified personal risk factors, not elsewhere classified: Secondary | ICD-10-CM | POA: Diagnosis not present

## 2019-11-08 DIAGNOSIS — Z6841 Body Mass Index (BMI) 40.0 and over, adult: Secondary | ICD-10-CM

## 2019-11-08 MED ORDER — VITAMIN D (ERGOCALCIFEROL) 1.25 MG (50000 UNIT) PO CAPS: 50000.0000 [IU] | ORAL_CAPSULE | ORAL | 0 refills | Status: DC

## 2019-11-08 MED ORDER — AMLODIPINE BESYLATE 10 MG PO TABS: 10.0000 mg | ORAL_TABLET | Freq: Every day | ORAL | 0 refills | Status: DC

## 2019-11-08 NOTE — Progress Notes (Signed)
Chief Complaint:   OBESITY Katherine Arellano is here to discuss her progress with her obesity treatment plan along with follow-up of her obesity related diagnoses. Katherine Arellano is on the Category 3 Plan and states she is following her eating plan approximately 50% of the time. Katherine Arellano states she is doing cardio/weights 60 minutes 5 times per week.  Today's visit was #: 4 Starting weight: 276 lbs Starting date: 09/04/2019 Today's weight: 271 lbs Today's date: 11/08/2019 Total lbs lost to date: 5 Total lbs lost since last in-office visit: 2  Interim History: Katherine Arellano is down 2 lbs. She states she is exercising more.  Subjective:   Essential hypertension. Katherine Arellano is taking Norvasc. Blood pressure is controlled.  BP Readings from Last 3 Encounters:  10/11/19 127/81  09/18/19 (!) 126/57  09/04/19 133/70   Lab Results  Component Value Date   CREATININE 0.94 09/04/2019   Vitamin D deficiency. No nausea, vomiting, or muscle weakness. Last Vitamin D 30.4 on 09/04/2019.  At risk for heart disease. Katherine Arellano is at a higher than average risk for cardiovascular disease due to hypertension and obesity.   Assessment/Plan:   Essential hypertension. Katherine Arellano is working on healthy weight loss and exercise to improve blood pressure control. We will watch for signs of hypotension as she continues her lifestyle modifications. Prescription was given for amLODipine (NORVASC) 10 MG tablet 1 PO daily #30 with 0 refills.  Vitamin D deficiency. Low Vitamin D level contributes to fatigue and are associated with obesity, breast, and colon cancer. She was given a prescription for Vitamin D, Ergocalciferol, (DRISDOL) 1.25 MG (50000 UNIT) CAPS capsule every week #4 with 0 refills and will follow-up for routine testing of Vitamin D, at least 2-3 times per year to avoid over-replacement.    At risk for heart disease. Katherine Arellano was given approximately 15 minutes of coronary artery disease prevention counseling today. She is 44 y.o.  female and has risk factors for heart disease including obesity. We discussed intensive lifestyle modifications today with an emphasis on specific weight loss instructions and strategies.   Repetitive spaced learning was employed today to elicit superior memory formation and behavioral change.  Class 3 severe obesity with serious comorbidity and body mass index (BMI) of 45.0 to 49.9 in adult, unspecified obesity type (Katherine Arellano).  Katherine Arellano is currently in the action stage of change. As such, her goal is to continue with weight loss efforts. She has agreed to the Category 3 Plan.   She will work on meal planning, intentional eating, and will set an alarm to trigger her eating.  Exercise goals: Katherine Arellano will continue her current exercise regimen.  Behavioral modification strategies: increasing lean protein intake, decreasing simple carbohydrates, increasing vegetables, increasing water intake, decreasing eating out, no skipping meals, meal planning and cooking strategies, keeping healthy foods in the home and planning for success.  Katherine Arellano has agreed to follow-up with our clinic in 2 weeks. She was informed of the importance of frequent follow-up visits to maximize her success with intensive lifestyle modifications for her multiple health conditions.   Objective:   Pulse 86, temperature 97.7 F (36.5 C), height 5\' 5"  (1.651 m), weight 271 lb (122.9 kg), last menstrual period 10/26/2019, SpO2 100 %. Body mass index is 45.1 kg/m.  General: Cooperative, alert, well developed, in no acute distress. HEENT: Conjunctivae and lids unremarkable. Cardiovascular: Regular rhythm.  Lungs: Normal work of breathing. Neurologic: No focal deficits.   Lab Results  Component Value Date   CREATININE 0.94 09/04/2019  BUN 9 09/04/2019   NA 139 09/04/2019   K 4.0 09/04/2019   CL 102 09/04/2019   CO2 26 09/04/2019   Lab Results  Component Value Date   ALT 10 09/04/2019   AST 16 09/04/2019   ALKPHOS 53 09/04/2019    BILITOT 0.3 09/04/2019   Lab Results  Component Value Date   HGBA1C 4.6 (L) 09/04/2019   Lab Results  Component Value Date   INSULIN 7.3 09/04/2019   Lab Results  Component Value Date   TSH 1.080 09/04/2019   Lab Results  Component Value Date   CHOL 169 09/04/2019   HDL 45 09/04/2019   LDLCALC 112 (H) 09/04/2019   TRIG 61 09/04/2019   Lab Results  Component Value Date   WBC 4.9 09/04/2019   HGB 12.8 09/04/2019   HCT 37.6 09/04/2019   MCV 89 09/04/2019   PLT 353 09/04/2019   No results found for: IRON, TIBC, FERRITIN  Attestation Statements:   Reviewed by clinician on day of visit: allergies, medications, problem list, medical history, surgical history, family history, social history, and previous encounter notes.  [name], am acting as Location manager for [employer], DO   I have reviewed the above documentation for accuracy and completeness, and I agree with the above. Jearld Lesch, DO

## 2019-11-14 ENCOUNTER — Other Ambulatory Visit: Payer: Self-pay

## 2019-11-14 ENCOUNTER — Telehealth (INDEPENDENT_AMBULATORY_CARE_PROVIDER_SITE_OTHER): Payer: 59 | Admitting: Psychology

## 2019-11-14 DIAGNOSIS — F5089 Other specified eating disorder: Secondary | ICD-10-CM

## 2019-11-22 ENCOUNTER — Other Ambulatory Visit: Payer: Self-pay

## 2019-11-22 ENCOUNTER — Encounter (INDEPENDENT_AMBULATORY_CARE_PROVIDER_SITE_OTHER): Payer: Self-pay | Admitting: Bariatrics

## 2019-11-22 ENCOUNTER — Ambulatory Visit (INDEPENDENT_AMBULATORY_CARE_PROVIDER_SITE_OTHER): Payer: 59 | Admitting: Bariatrics

## 2019-11-22 VITALS — BP 134/82 | HR 80 | Temp 98.3°F | Ht 65.0 in | Wt 269.0 lb

## 2019-11-22 DIAGNOSIS — F3289 Other specified depressive episodes: Secondary | ICD-10-CM

## 2019-11-22 DIAGNOSIS — E559 Vitamin D deficiency, unspecified: Secondary | ICD-10-CM

## 2019-11-22 DIAGNOSIS — Z9189 Other specified personal risk factors, not elsewhere classified: Secondary | ICD-10-CM | POA: Diagnosis not present

## 2019-11-22 DIAGNOSIS — I10 Essential (primary) hypertension: Secondary | ICD-10-CM | POA: Diagnosis not present

## 2019-11-22 DIAGNOSIS — Z6841 Body Mass Index (BMI) 40.0 and over, adult: Secondary | ICD-10-CM

## 2019-11-22 MED ORDER — AMLODIPINE BESYLATE 10 MG PO TABS: 10.0000 mg | ORAL_TABLET | Freq: Every day | ORAL | 0 refills | Status: DC

## 2019-11-22 MED ORDER — VITAMIN D (ERGOCALCIFEROL) 1.25 MG (50000 UNIT) PO CAPS: 50000.0000 [IU] | ORAL_CAPSULE | ORAL | 0 refills | Status: DC

## 2019-11-22 NOTE — Progress Notes (Signed)
Chief Complaint:   OBESITY Katherine Arellano is here to discuss her progress with her obesity treatment plan along with follow-up of her obesity related diagnoses. Katherine Arellano is on the Category 3 Plan and states she is following her eating plan approximately 30% of the time. Nari states she is doing cardio/strengthening 30-60 minutes 3-4 times per week.  Today's visit was #: 5 Starting weight: 276 lbs Starting date: 09/04/2019 Today's weight: 269 lbs Today's date: 11/22/2019 Total lbs lost to date: 7 Total lbs lost since last in-office visit: 2  Interim History: Katherine Arellano is down 2 lbs and doing well overall. She has struggled with the meal plan.  Subjective:   Vitamin D deficiency. No nausea, vomiting, or muscle weakness. Last Vitamin D 30.4 on 09/04/2019.  Essential hypertension. Blood pressure is controlled.  BP Readings from Last 3 Encounters:  11/22/19 134/82  11/08/19 133/74  10/11/19 127/81   Lab Results  Component Value Date   CREATININE 0.94 09/04/2019   Other depression, with emotional eating. Katherine Arellano is struggling with emotional eating and using food for comfort to the extent that it is negatively impacting her health. She has been working on behavior modification techniques to help reduce her emotional eating and has been somewhat successful. She shows no sign of suicidal or homicidal ideations. Avanthika sees Dr. Mallie Mussel.  At risk for osteoporosis. Katherine Arellano is at higher risk of osteopenia and osteoporosis due to Vitamin D deficiency.   Assessment/Plan:   Vitamin D deficiency. Low Vitamin D level contributes to fatigue and are associated with obesity, breast, and colon cancer. She was given a prescription for Vitamin D, Ergocalciferol, (DRISDOL) 1.25 MG (50000 UNIT) CAPS capsule every week #4 with 0 refills and will follow-up for routine testing of Vitamin D, at least 2-3 times per year to avoid over-replacement.   Essential hypertension. Katherine Arellano is working on healthy weight loss and  exercise to improve blood pressure control. We will watch for signs of hypotension as she continues her lifestyle modifications. Prescription was given for amLODipine (NORVASC) 10 MG tablet 1 PO daily #30 with 0 refills.  Other depression, with emotional eating. Behavior modification techniques were discussed today to help Katherine Arellano deal with her emotional/non-hunger eating behaviors.  Orders and follow up as documented in patient record. She will continue seeing Dr. Mallie Mussel as scheduled.  At risk for osteoporosis. Katherine Arellano was given approximately 15 minutes of osteoporosis prevention counseling today. Katherine Arellano is at risk for osteopenia and osteoporosis due to her Vitamin D deficiency. She was encouraged to take her Vitamin D and follow her higher calcium diet and increase strengthening exercise to help strengthen her bones and decrease her risk of osteopenia and osteoporosis.  Repetitive spaced learning was employed today to elicit superior memory formation and behavioral change.  Class 3 severe obesity with serious comorbidity and body mass index (BMI) of 40.0 to 44.9 in adult, unspecified obesity type ([place]).  Katherine Arellano is currently in the action stage of change. As such, her goal is to continue with weight loss efforts. She has agreed to the Category 3 Plan.   She will work on meal planning, intentional eating, being more adherent to the plan, work on not skipping meals, and will track food on "My Fitness Pal."  Exercise goals: Katherine Arellano will continue her current exercise regimen of cardio/strengthening 30-60 minutes 3-4 times per week.  Behavioral modification strategies: increasing lean protein intake, decreasing simple carbohydrates, increasing vegetables, increasing water intake, decreasing eating out, no skipping meals, meal planning and cooking  strategies, keeping healthy foods in the home and planning for success.  Katherine Arellano has agreed to follow-up with our clinic in 3-4 weeks. She was informed of the  importance of frequent follow-up visits to maximize her success with intensive lifestyle modifications for her multiple health conditions.   Objective:   Blood pressure 134/82, pulse 80, temperature 98.3 F (36.8 C), temperature source Oral, height 5\' 5"  (1.651 m), weight 269 lb (122 kg), last menstrual period 10/26/2019, SpO2 100 %. Body mass index is 44.76 kg/m.  General: Cooperative, alert, well developed, in no acute distress. HEENT: Conjunctivae and lids unremarkable. Cardiovascular: Regular rhythm.  Lungs: Normal work of breathing. Neurologic: No focal deficits.   Lab Results  Component Value Date   CREATININE 0.94 09/04/2019   BUN 9 09/04/2019   NA 139 09/04/2019   K 4.0 09/04/2019   CL 102 09/04/2019   CO2 26 09/04/2019   Lab Results  Component Value Date   ALT 10 09/04/2019   AST 16 09/04/2019   ALKPHOS 53 09/04/2019   BILITOT 0.3 09/04/2019   Lab Results  Component Value Date   HGBA1C 4.6 (L) 09/04/2019   Lab Results  Component Value Date   INSULIN 7.3 09/04/2019   Lab Results  Component Value Date   TSH 1.080 09/04/2019   Lab Results  Component Value Date   CHOL 169 09/04/2019   HDL 45 09/04/2019   LDLCALC 112 (H) 09/04/2019   TRIG 61 09/04/2019   Lab Results  Component Value Date   WBC 4.9 09/04/2019   HGB 12.8 09/04/2019   HCT 37.6 09/04/2019   MCV 89 09/04/2019   PLT 353 09/04/2019   No results found for: IRON, TIBC, FERRITIN  Attestation Statements:   Reviewed by clinician on day of visit: allergies, medications, problem list, medical history, surgical history, family history, social history, and previous encounter notes.  [name], am acting as Location manager for [employer], DO   I have reviewed the above documentation for accuracy and completeness, and I agree with the above. Jearld Lesch, DO

## 2019-11-23 ENCOUNTER — Other Ambulatory Visit (INDEPENDENT_AMBULATORY_CARE_PROVIDER_SITE_OTHER): Payer: Self-pay | Admitting: Bariatrics

## 2019-11-23 DIAGNOSIS — E559 Vitamin D deficiency, unspecified: Secondary | ICD-10-CM

## 2019-11-23 DIAGNOSIS — I10 Essential (primary) hypertension: Secondary | ICD-10-CM

## 2019-11-23 NOTE — Progress Notes (Signed)
  Office: [phone]  /  Fax: [phone]    Date: December 07, 2019   Appointment Start Time: 8:30am Duration: 30 minutes Provider: Glennie Isle, Psy.D. Type of Session: Individual Therapy  Location of Patient: Home Location of Provider: Provider's Home Type of Contact: Telepsychological Visit via MyChart Video Visit  Session Content: Katherine Arellano is a 43 y.o. female presenting via MyChart Video Visit for a follow-up appointment to address the previously established treatment goal of increasing coping skills. Today's appointment was a telepsychological visit due to COVID-19. Katherine Arellano provided verbal consent for today's telepsychological appointment and she is aware she is responsible for securing confidentiality on her end of the session. Prior to proceeding with today's appointment, Katherine Arellano's physical location at the time of this appointment was obtained as well a phone number she could be reached at in the event of technical difficulties. Katherine Arellano and this provider participated in today's telepsychological service.   This provider conducted a brief check-in. Brizeida discussed recent events, including plans to visit her parents in July and an increase in physical activity. She also described an increase in self-care. Additionally, triggers for emotional eating were reviewed. Katherine Arellano acknowledged experiencing out of habit, fatigue and boredom eating, adding focusing on self-care has helped her cope. Moreover, psychoeducation regarding mindfulness was provided. A handout was provided to Katherine Arellano with further information regarding mindfulness, including exercises. This provider also explained the benefit of mindfulness as it relates to emotional eating. Katherine Arellano was encouraged to engage in the provided exercises between now and the next appointment with this provider. Katherine Arellano agreed. During today's appointment, Katherine Arellano was led through a mindfulness exercise involving her senses. Katherine Arellano provided verbal consent during today's  appointment for this provider to send a handout about mindfulness via e-mail. Katherine Arellano was receptive to today's appointment as evidenced by openness to sharing, responsiveness to feedback, and willingness to engage in mindfulness exercises to assist with coping.  Mental Status Examination:  Appearance: well groomed and appropriate hygiene  Behavior: appropriate to circumstances Mood: euthymic Affect: mood congruent Speech: normal in rate, volume, and tone Eye Contact: appropriate Psychomotor Activity: appropriate Gait: unable to assess Thought Process: linear, logical, and goal directed  Thought Content/Perception: no hallucinations, delusions, bizarre thinking or behavior reported or observed and no evidence of suicidal and homicidal ideation, plan, and intent Orientation: time, person, place and purpose of appointment Memory/Concentration: memory, attention, language, and fund of knowledge intact  Insight/Judgment: good  Interventions:  Conducted a brief chart review Provided empathic reflections and validation Reviewed content from the previous session Employed supportive psychotherapy interventions to facilitate reduced distress and to improve coping skills with identified stressors Employed motivational interviewing skills to assess patient's willingness/desire to adhere to recommended medical treatments and assignments Psychoeducation provided regarding mindfulness Engaged patient in mindfulness exercise(s) Employed acceptance and commitment interventions to emphasize mindfulness and acceptance without struggle  DSM-5 Diagnosis(es): 307.59 (F50.8) Other Specified Feeding or Eating Disorder, Emotional Eating Behaviors  Treatment Goal & Progress: During the initial appointment with this provider, the following treatment goal was established: increase coping skills. Katherine Arellano has demonstrated progress in her goal as evidenced by increased awareness of hunger patterns and increased awareness  of triggers for emotional eating. Katherine Arellano also demonstrates willingness to engage in mindfulness exercises.  Plan: The next appointment will be scheduled in two weeks, which will be via MyChart Video Visit. The next session will focus on working towards the established treatment goal.

## 2019-11-24 ENCOUNTER — Other Ambulatory Visit (INDEPENDENT_AMBULATORY_CARE_PROVIDER_SITE_OTHER): Payer: Self-pay | Admitting: Bariatrics

## 2019-11-24 DIAGNOSIS — E559 Vitamin D deficiency, unspecified: Secondary | ICD-10-CM

## 2019-11-24 DIAGNOSIS — I10 Essential (primary) hypertension: Secondary | ICD-10-CM

## 2019-12-07 ENCOUNTER — Telehealth (INDEPENDENT_AMBULATORY_CARE_PROVIDER_SITE_OTHER): Payer: 59 | Admitting: Psychology

## 2019-12-07 ENCOUNTER — Other Ambulatory Visit: Payer: Self-pay

## 2019-12-07 DIAGNOSIS — F5089 Other specified eating disorder: Secondary | ICD-10-CM

## 2019-12-07 NOTE — Progress Notes (Signed)
  Office: [phone]  /  Fax: [phone]    Date: December 21, 2019   Appointment Start Time: 8:29am Duration: 24 minutes Provider: Glennie Isle, Psy.D. Type of Session: Individual Therapy  Location of Patient: Home Location of Provider: Provider's Home Type of Contact: Telepsychological Visit via MyChart Video Visit  Session Content: Katherine Arellano is a 44 y.o. female presenting via MyChart Video Visit for a follow-up appointment to address the previously established treatment goal of increasing coping skills. Today's appointment was a telepsychological visit due to COVID-19. Nolan provided verbal consent for today's telepsychological appointment and she is aware she is responsible for securing confidentiality on her end of the session. Prior to proceeding with today's appointment, Katherine Arellano's physical location at the time of this appointment was obtained as well a phone number she could be reached at in the event of technical difficulties. Serita and this provider participated in today's telepsychological service.   This provider conducted a brief check-in. Katherine Arellano shared her maternal great aunt passed away on 10-14-22 and she has been busy with work. She disclosed the loss impacted her mood and being busy with work resulted in skipping meals. She was receptive to setting alarms on her phone as reminders to eat. Due to concentration issues resulting from recent events, today's appointment focused further on mindfulness. She was led through an exercise focusing on her breathing. Her experience was processed. Chad provided verbal consent during today's appointment for this provider to send the handout for today's exercise via e-mail. This provider also discussed the utilization of YouTube for mindfulness exercises (e.g., exercises by Merri Ray). Katherine Arellano was receptive to today's appointment as evidenced by openness to sharing, responsiveness to feedback, and willingness to continue engaging in mindfulness  exercises.  Mental Status Examination:  Appearance: well groomed and appropriate hygiene  Behavior: appropriate to circumstances Mood: sad Affect: mood congruent Speech: normal in rate, volume, and tone Eye Contact: appropriate Psychomotor Activity: appropriate Gait: unable to assess Thought Process: linear, logical, and goal directed  Thought Content/Perception: no hallucinations, delusions, bizarre thinking or behavior reported or observed and denies suicidal and homicidal ideation, plan, and intent Orientation: time, person, place, and purpose of appointment Memory/Concentration: memory, attention, language, and fund of knowledge intact  Insight/Judgment: good  Interventions:  Conducted a brief chart review Provided empathic reflections and validation Employed supportive psychotherapy interventions to facilitate reduced distress and to improve coping skills with identified stressors Employed motivational interviewing skills to assess patient's willingness/desire to adhere to recommended medical treatments and assignments Engaged patient in problem solving Engaged patient in mindfulness exercise(s) Employed acceptance and commitment interventions to emphasize mindfulness and acceptance without struggle  DSM-5 Diagnosis(es): 307.59 (F50.8) Other Specified Feeding or Eating Disorder, Emotional Eating Behaviors  Treatment Goal & Progress: During the initial appointment with this provider, the following treatment goal was established: increase coping skills. Katherine Arellano has demonstrated progress in her goal as evidenced by increased awareness of hunger patterns and increased awareness of triggers for emotional eating. Katherine Arellano also continues to demonstrate willingness to engage in learned skill(s).  Plan: Due to [facility] traveling out of state in the coming weeks, the next appointment will be scheduled in approximately 2-3 weeks, which will be via MyChart Video Visit. The next session will focus on  working towards the established treatment goal.

## 2019-12-19 ENCOUNTER — Ambulatory Visit (INDEPENDENT_AMBULATORY_CARE_PROVIDER_SITE_OTHER): Payer: 59 | Admitting: Bariatrics

## 2019-12-19 ENCOUNTER — Other Ambulatory Visit: Payer: Self-pay

## 2019-12-19 ENCOUNTER — Encounter (INDEPENDENT_AMBULATORY_CARE_PROVIDER_SITE_OTHER): Payer: Self-pay | Admitting: Bariatrics

## 2019-12-19 VITALS — BP 116/77 | HR 78 | Temp 98.6°F | Ht 62.0 in | Wt 269.0 lb

## 2019-12-19 DIAGNOSIS — I10 Essential (primary) hypertension: Secondary | ICD-10-CM

## 2019-12-19 DIAGNOSIS — Z6841 Body Mass Index (BMI) 40.0 and over, adult: Secondary | ICD-10-CM

## 2019-12-19 DIAGNOSIS — E559 Vitamin D deficiency, unspecified: Secondary | ICD-10-CM | POA: Diagnosis not present

## 2019-12-20 ENCOUNTER — Encounter (INDEPENDENT_AMBULATORY_CARE_PROVIDER_SITE_OTHER): Payer: Self-pay | Admitting: Bariatrics

## 2019-12-20 NOTE — Progress Notes (Signed)
Chief Complaint:   OBESITY Katherine Arellano is here to discuss her progress with her obesity treatment plan along with follow-up of her obesity related diagnoses. Katherine Arellano is on the Category 3 Plan and states she is following her eating plan approximately 25% of the time. Katherine Arellano states she is doing cardio and strengthening 30 minutes 3 times per week and aerobics 60 minutes 4 times per week.  Today's visit was #: 6 Starting weight: 276 lbs Starting date: 09/04/2019 Today's weight: 269 lbs Today's date: 12/19/2019 Total lbs lost to date: 7 Total lbs lost since last in-office visit: 0  Interim History: Katherine Arellano's weight remains the same. She has not been sticking to the plan as well.  Subjective:   Vitamin D deficiency. Whisper is taking Vitamin D supplementation. Last Vitamin D was 30.4 on 09/04/2019.  Essential hypertension. Katherine Arellano is taking Norvasc. Blood pressure is well controlled.  BP Readings from Last 3 Encounters:  12/19/19 116/77  11/22/19 134/82  11/08/19 133/74   Lab Results  Component Value Date   CREATININE 0.94 09/04/2019   Assessment/Plan:   Vitamin D deficiency. Low Vitamin D level contributes to fatigue and are associated with obesity, breast, and colon cancer. She agrees to continue to take Vitamin D and will follow-up for routine testing of Vitamin D, at least 2-3 times per year to avoid over-replacement.  Essential hypertension. Katherine Arellano is working on healthy weight loss and exercise to improve blood pressure control. We will watch for signs of hypotension as she continues her lifestyle modifications. She will continue her medication as directed.  Class 3 severe obesity with serious comorbidity and body mass index (BMI) of 40.0 to 44.9 in adult, unspecified obesity type ([place]).  Katherine Arellano is currently in the action stage of change. As such, her goal is to continue with weight loss efforts. She has agreed to the Category 3 Plan.   She will work on meal planning, intentional  eating, and being more adherent to the plan.  Exercise goals: Katherine Arellano will continue her current exercise regimen.   Behavioral modification strategies: increasing lean protein intake, decreasing simple carbohydrates, increasing vegetables, increasing water intake, decreasing eating out, no skipping meals, meal planning and cooking strategies, keeping healthy foods in the home and planning for success.  Katherine Arellano has agreed to follow-up with our clinic in 2 weeks. She was informed of the importance of frequent follow-up visits to maximize her success with intensive lifestyle modifications for her multiple health conditions.   Objective:   Blood pressure 116/77, pulse 78, temperature 98.6 F (37 C), height 5\' 2"  (1.575 m), weight 269 lb (122 kg), SpO2 99 %. Body mass index is 49.2 kg/m.  General: Cooperative, alert, well developed, in no acute distress. HEENT: Conjunctivae and lids unremarkable. Cardiovascular: Regular rhythm.  Lungs: Normal work of breathing. Neurologic: No focal deficits.   Lab Results  Component Value Date   CREATININE 0.94 09/04/2019   BUN 9 09/04/2019   NA 139 09/04/2019   K 4.0 09/04/2019   CL 102 09/04/2019   CO2 26 09/04/2019   Lab Results  Component Value Date   ALT 10 09/04/2019   AST 16 09/04/2019   ALKPHOS 53 09/04/2019   BILITOT 0.3 09/04/2019   Lab Results  Component Value Date   HGBA1C 4.6 (L) 09/04/2019   Lab Results  Component Value Date   INSULIN 7.3 09/04/2019   Lab Results  Component Value Date   TSH 1.080 09/04/2019   Lab Results  Component Value Date  CHOL 169 09/04/2019   HDL 45 09/04/2019   LDLCALC 112 (H) 09/04/2019   TRIG 61 09/04/2019   Lab Results  Component Value Date   WBC 4.9 09/04/2019   HGB 12.8 09/04/2019   HCT 37.6 09/04/2019   MCV 89 09/04/2019   PLT 353 09/04/2019   No results found for: IRON, TIBC, FERRITIN  Attestation Statements:   Reviewed by clinician on day of visit: allergies, medications,  problem list, medical history, surgical history, family history, social history, and previous encounter notes.  Time spent on visit including pre-visit chart review and post-visit charting and care was 20 minutes.   [name], am acting as Location manager for [employer], DO   I have reviewed the above documentation for accuracy and completeness, and I agree with the above. Jearld Lesch, DO

## 2019-12-21 ENCOUNTER — Other Ambulatory Visit: Payer: Self-pay

## 2019-12-21 ENCOUNTER — Telehealth (INDEPENDENT_AMBULATORY_CARE_PROVIDER_SITE_OTHER): Payer: 59 | Admitting: Psychology

## 2019-12-21 DIAGNOSIS — F5089 Other specified eating disorder: Secondary | ICD-10-CM

## 2019-12-26 NOTE — Progress Notes (Signed)
  Office: [phone]  /  Fax: [phone]    Date: January 09, 2020   Appointment Start Time: 4:32pm Duration: 26 minutes Provider: Glennie Isle, Psy.D. Type of Session: Individual Therapy  Location of Patient: Home Location of Provider: [facility] Type of Contact: Telepsychological Visit via Telephone Call  Session Content: Katherine Arellano is a 44 y.o. female presenting via telephone call for a follow-up appointment to address the previously established treatment goal of increasing coping skills. Of note, today's appointment was switched to a regular telephone call as this provider was unable to connect via [place] Visit. Katherine Arellano provided verbal consent to proceed. Today's appointment was a telepsychological visit due to COVID-19. Katherine Arellano provided verbal consent for today's telepsychological appointment and she is aware she is responsible for securing confidentiality on her end of the session. Prior to proceeding with today's appointment, Katherine Arellano's physical location at the time of this appointment was obtained as well a phone number she could be reached at in the event of technical difficulties. Katherine Arellano and this provider participated in today's telepsychological service.   This provider conducted a brief check-in. Katherine Arellano shared about her recent trip back home, noting, "There were no issues." She further shared engaging in mindfulness was helpful. Regarding eating, Katherine Arellano stated she focused on making better choices and engaging in portion control during her trip and since returning home. She was encouraged to focus on protein intake. Session focused further on mindfulness to assist with coping. Katherine Arellano was led through a mindfulness exercise (A Taste of Mindfulness) and her experience was processed. Katherine Arellano provided verbal consent during today's appointment for this provider to send a handout for today's exercise via e-mail. Furthermore, termination planning was discussed as she described a  reduction in emotional eating and increased ability to cope. Katherine Arellano was receptive to a follow-up appointment in 3-4 weeks and an additional follow-up/termination appointment in 3-4 weeks after that. Katherine Arellano was receptive to today's appointment as evidenced by openness to sharing, responsiveness to feedback, and willingness to continue engaging in mindfulness exercises.  Mental Status Examination:  Appearance: unable to assess  Behavior: unable to assess Mood: euthymic Affect: unable to fully assess Speech: normal in rate, volume, and tone Eye Contact: unable to assess Psychomotor Activity: unable to assess  Gait: unable to assess Thought Process: linear, logical, and goal directed  Thought Content/Perception: no hallucinations, delusions, bizarre thinking or behavior reported or observed and no evidence of suicidal and homicidal ideation, plan, and intent Orientation: time, person, place, and purpose of appointment Memory/Concentration: memory, attention, language, and fund of knowledge intact  Insight/Judgment: good  Interventions:  Conducted a brief chart review Provided empathic reflections and validation Employed supportive psychotherapy interventions to facilitate reduced distress and to improve coping skills with identified stressors Engaged patient in mindfulness exercise(s) Employed acceptance and commitment interventions to emphasize mindfulness and acceptance without struggle Discussed termination planning  DSM-5 Diagnosis(es): 307.59 (F50.8) Other Specified Feeding or Eating Disorder, Emotional Eating Behaviors  Treatment Goal & Progress: During the initial appointment with this provider, the following treatment goal was established: increase coping skills. Katherine Arellano has demonstrated progress in her goal as evidenced by increased awareness of hunger patterns, increased awareness of triggers for emotional eating and reduction in emotional eating. Katherine Arellano also continues to demonstrate  willingness to engage in learned skill(s).  Plan: The next appointment will be scheduled in one month, which will be via MyChart Video Visit. The next session will focus on working towards the established treatment goal.

## 2020-01-09 ENCOUNTER — Ambulatory Visit (INDEPENDENT_AMBULATORY_CARE_PROVIDER_SITE_OTHER): Payer: 59 | Admitting: Psychology

## 2020-01-09 ENCOUNTER — Other Ambulatory Visit: Payer: Self-pay

## 2020-01-09 DIAGNOSIS — F5089 Other specified eating disorder: Secondary | ICD-10-CM

## 2020-01-11 ENCOUNTER — Encounter (INDEPENDENT_AMBULATORY_CARE_PROVIDER_SITE_OTHER): Payer: Self-pay | Admitting: Bariatrics

## 2020-01-11 ENCOUNTER — Ambulatory Visit (INDEPENDENT_AMBULATORY_CARE_PROVIDER_SITE_OTHER): Payer: 59 | Admitting: Bariatrics

## 2020-01-11 ENCOUNTER — Other Ambulatory Visit: Payer: Self-pay

## 2020-01-11 VITALS — BP 119/76 | HR 82 | Temp 98.4°F | Ht 62.0 in | Wt 268.0 lb

## 2020-01-11 DIAGNOSIS — I10 Essential (primary) hypertension: Secondary | ICD-10-CM | POA: Diagnosis not present

## 2020-01-11 DIAGNOSIS — E559 Vitamin D deficiency, unspecified: Secondary | ICD-10-CM

## 2020-01-11 DIAGNOSIS — Z9189 Other specified personal risk factors, not elsewhere classified: Secondary | ICD-10-CM

## 2020-01-11 DIAGNOSIS — Z6841 Body Mass Index (BMI) 40.0 and over, adult: Secondary | ICD-10-CM

## 2020-01-11 MED ORDER — VITAMIN D (ERGOCALCIFEROL) 1.25 MG (50000 UNIT) PO CAPS: 50000.0000 [IU] | ORAL_CAPSULE | ORAL | 0 refills | Status: DC

## 2020-01-11 MED ORDER — AMLODIPINE BESYLATE 10 MG PO TABS: 10.0000 mg | ORAL_TABLET | Freq: Every day | ORAL | 0 refills | Status: DC

## 2020-01-11 NOTE — Progress Notes (Signed)
Chief Complaint:   OBESITY Katherine Arellano is here to discuss her progress with her obesity treatment plan along with follow-up of her obesity related diagnoses. Katherine Arellano is on the Category 3 Plan and states she is following her eating plan approximately 30% of the time. Katherine Arellano states she is doing aerobics/strengthening 30 minutes 2 times per week.  Today's visit was #: 8 Starting weight: 276 lbs Starting date: 09/04/2019 Today's weight: 268 lbs Today's date: 01/11/2020 Total lbs lost to date: 8 Total lbs lost since last in-office visit: 1  Interim History: Katherine Arellano is down 1 additional lb. She was traveling last week. She states she will increase her water intake.  Subjective:   Essential hypertension. Katherine Arellano is taking Norvasc. Blood pressure is controlled.  BP Readings from Last 3 Encounters:  01/11/20 119/76  12/19/19 116/77  11/22/19 134/82   Lab Results  Component Value Date   CREATININE 0.94 09/04/2019   Vitamin D deficiency. No nausea, vomiting, or muscle weakness.    Ref. Range 09/04/2019 13:44  Vitamin D, 25-Hydroxy Latest Ref Range: 30.0 - 100.0 ng/mL 30.4   At risk for heart disease. Katherine Arellano is at a higher than average risk for cardiovascular disease due to obesity.   Assessment/Plan:   Essential hypertension. Katherine Arellano is working on healthy weight loss and exercise to improve blood pressure control. We will watch for signs of hypotension as she continues her lifestyle modifications. Katherine Arellano will continue amLODipine (NORVASC) 10 MG tablet 1 PO daily #30 with 0 refills.  Vitamin D deficiency. Low Vitamin D level contributes to fatigue and are associated with obesity, breast, and colon cancer. She was given a prescription for Vitamin D, Ergocalciferol, (DRISDOL) 1.25 MG (50000 UNIT) CAPS capsule every week #4 with 0 refills and will follow-up for routine testing of Vitamin D, at least 2-3 times per year to avoid over-replacement.   At risk for heart disease. Katherine Arellano was given  approximately 15 minutes of coronary artery disease prevention counseling today. She is 44 y.o. female and has risk factors for heart disease including obesity. We discussed intensive lifestyle modifications today with an emphasis on specific weight loss instructions and strategies.   Repetitive spaced learning was employed today to elicit superior memory formation and behavioral change.  Class 3 severe obesity with serious comorbidity and body mass index (BMI) of 45.0 to 49.9 in adult, unspecified obesity type (Katherine Arellano).  Katherine Arellano is currently in the action stage of change. As such, her goal is to continue with weight loss efforts. She has agreed to the Category 3 Plan.   She will work on meal planning, intentional eating, being more adherent to the plan, and increasing her water intake.   Exercise goals: Katherine Arellano will continue aerobics/strengthening 30 minutes 2 times per week.  Behavioral modification strategies: increasing lean protein intake, decreasing simple carbohydrates, increasing vegetables, increasing water intake, decreasing eating out, no skipping meals, meal planning and cooking strategies, keeping healthy foods in the home and planning for success.  Katherine Arellano has agreed to follow-up with our clinic in 2 weeks. She was informed of the importance of frequent follow-up visits to maximize her success with intensive lifestyle modifications for her multiple health conditions.   Objective:   Blood pressure 119/76, pulse 82, temperature 98.4 F (36.9 C), height 5\' 2"  (1.575 m), weight 268 lb (121.6 kg), SpO2 100 %. Body mass index is 49.02 kg/m.  General: Cooperative, alert, well developed, in no acute distress. HEENT: Conjunctivae and lids unremarkable. Cardiovascular: Regular rhythm.  Lungs:  Normal work of breathing. Neurologic: No focal deficits.   Lab Results  Component Value Date   CREATININE 0.94 09/04/2019   BUN 9 09/04/2019   NA 139 09/04/2019   K 4.0 09/04/2019   CL 102  09/04/2019   CO2 26 09/04/2019   Lab Results  Component Value Date   ALT 10 09/04/2019   AST 16 09/04/2019   ALKPHOS 53 09/04/2019   BILITOT 0.3 09/04/2019   Lab Results  Component Value Date   HGBA1C 4.6 (L) 09/04/2019   Lab Results  Component Value Date   INSULIN 7.3 09/04/2019   Lab Results  Component Value Date   TSH 1.080 09/04/2019   Lab Results  Component Value Date   CHOL 169 09/04/2019   HDL 45 09/04/2019   LDLCALC 112 (H) 09/04/2019   TRIG 61 09/04/2019   Lab Results  Component Value Date   WBC 4.9 09/04/2019   HGB 12.8 09/04/2019   HCT 37.6 09/04/2019   MCV 89 09/04/2019   PLT 353 09/04/2019   No results found for: IRON, TIBC, FERRITIN  Attestation Statements:   Reviewed by clinician on day of visit: allergies, medications, problem list, medical history, surgical history, family history, social history, and previous encounter notes.  [name], am acting as Location manager for [employer], DO   I have reviewed the above documentation for accuracy and completeness, and I agree with the above. Jearld Lesch, DO

## 2020-01-15 ENCOUNTER — Encounter (INDEPENDENT_AMBULATORY_CARE_PROVIDER_SITE_OTHER): Payer: Self-pay | Admitting: Bariatrics

## 2020-01-23 NOTE — Progress Notes (Signed)
  Office: [phone]  /  Fax: [phone]    Date: February 06, 2020   Appointment Start Time: 4:32pm Duration: 27 minutes Provider: Glennie Isle, Psy.D. Type of Session: Individual Therapy  Location of Patient: Home Location of Provider: [facility] Type of Contact: Telepsychological Visit via MyChart Video Visit  Session Content: Katherine Arellano is a 44 y.o. female presenting via [place] Visit for a follow-up appointment to address the previously established treatment goal of increasing coping skills. Today's appointment was a telepsychological visit due to COVID-19. Tarena provided verbal consent for today's telepsychological appointment and she is aware she is responsible for securing confidentiality on her end of the session. Prior to proceeding with today's appointment, [name]'s physical location at the time of this appointment was obtained as well a phone number she could be reached at in the event of technical difficulties. [name] and this provider participated in today's telepsychological service.   This provider conducted a brief check-in. [name] stated she is attending fitness classes versus working with a Physiological scientist. She described the classes as helping her confidence and socialization. She also discussed a reduction in consuming [employer], which used to be a reward for working out. Positive reinforcement was provided. Regarding eating, [name] reported challenges with eating protein; however, she acknowledged a reduction in emotional eating. Her eating habits were further explored to assist in problem solving. [name] was receptive to measuring protein regularly and having small snacks between meals. Moreover, [name] shared she continues to engage in mindfulness exercises. [name] was receptive to today's appointment as evidenced by openness to sharing, responsiveness to feedback, and willingness to implement discussed strategies .  Mental Status Examination:  Appearance:  well groomed and appropriate hygiene  Behavior: appropriate to circumstances Mood: euthymic Affect: mood congruent Speech: normal in rate, volume, and tone Eye Contact: appropriate Psychomotor Activity: appropriate Gait: unable to assess Thought Process: linear, logical, and goal directed  Thought Content/Perception: no hallucinations, delusions, bizarre thinking or behavior reported or observed and no evidence of suicidal and homicidal ideation, plan, and intent Orientation: time, person, place, and purpose of appointment Memory/Concentration: memory, attention, language, and fund of knowledge intact  Insight/Judgment: good   Interventions:  Conducted a brief chart review Provided empathic reflections and validation Provided positive reinforcement Employed supportive psychotherapy interventions to facilitate reduced distress and to improve coping skills with identified stressors Engaged patient in problem solving  DSM-5 Diagnosis(es): 307.59 (F50.8) Other Specified Feeding or Eating Disorder, Emotional Eating Behaviors  Treatment Goal & Progress: During the initial appointment with this provider, the following treatment goal was established: increase coping skills. [name] has demonstrated progress in her goal as evidenced by increased awareness of hunger patterns and increased awareness of triggers for emotional eating. [name] also continues to demonstrate willingness to engage in learned skill(s).  Plan: The next appointment will be scheduled in one month, which will be via MyChart Video Visit. The next session will focus on working towards the established treatment goal and termination.

## 2020-02-01 ENCOUNTER — Other Ambulatory Visit: Payer: Self-pay

## 2020-02-01 ENCOUNTER — Ambulatory Visit (INDEPENDENT_AMBULATORY_CARE_PROVIDER_SITE_OTHER): Payer: 59 | Admitting: Bariatrics

## 2020-02-01 VITALS — BP 134/78 | HR 74 | Temp 98.5°F | Ht 62.0 in | Wt 266.0 lb

## 2020-02-01 DIAGNOSIS — F3289 Other specified depressive episodes: Secondary | ICD-10-CM | POA: Diagnosis not present

## 2020-02-01 DIAGNOSIS — Z9189 Other specified personal risk factors, not elsewhere classified: Secondary | ICD-10-CM | POA: Diagnosis not present

## 2020-02-01 DIAGNOSIS — E559 Vitamin D deficiency, unspecified: Secondary | ICD-10-CM

## 2020-02-01 DIAGNOSIS — E66813 Obesity, class 3: Secondary | ICD-10-CM

## 2020-02-01 DIAGNOSIS — I10 Essential (primary) hypertension: Secondary | ICD-10-CM

## 2020-02-01 DIAGNOSIS — D508 Other iron deficiency anemias: Secondary | ICD-10-CM

## 2020-02-01 DIAGNOSIS — Z6841 Body Mass Index (BMI) 40.0 and over, adult: Secondary | ICD-10-CM

## 2020-02-01 MED ORDER — AMLODIPINE BESYLATE 10 MG PO TABS: 10.0000 mg | ORAL_TABLET | Freq: Every day | ORAL | 0 refills | Status: DC

## 2020-02-01 MED ORDER — VITAMIN D (ERGOCALCIFEROL) 1.25 MG (50000 UNIT) PO CAPS: 50000.0000 [IU] | ORAL_CAPSULE | ORAL | 0 refills | Status: DC

## 2020-02-01 NOTE — Progress Notes (Signed)
Chief Complaint:   OBESITY Katherine Arellano is here to discuss her progress with her obesity treatment plan along with follow-up of her obesity related diagnoses. Katherine Arellano is on the Category 3 Plan and states she is following her eating plan approximately 30% of the time. Katherine Arellano states she is doing dance aerobics 2.5 hours 2-3 times per week and cardio strengthening 3 hours 1 time per week.  Today's visit was #: 9 Starting weight: 276 lbs Starting date: 09/04/2019 Today's weight: 266 lbs Today's date: 02/01/2020 Total lbs lost to date: 10 Total lbs lost since last in-office visit: 2  Interim History: Katherine Arellano is down 2 lbs. She reports doing better with her water and protein intake.  Subjective:   Essential hypertension. Katherine Arellano is taking Norvasc.  BP Readings from Last 3 Encounters:  02/01/20 134/78  01/11/20 119/76  12/19/19 116/77   Lab Results  Component Value Date   CREATININE 0.94 09/04/2019   Vitamin D deficiency. No nausea, vomiting, or muscle weakness.    Ref. Range 09/04/2019 13:44  Vitamin D, 25-Hydroxy Latest Ref Range: 30.0 - 100.0 ng/mL 30.4   Other depression, with emotional eating. Katherine Arellano is struggling with emotional eating and using food for comfort to the extent that it is negatively impacting her health. She has been working on behavior modification techniques to help reduce her emotional eating and has been somewhat successful. She shows no sign of suicidal or homicidal ideations. Katherine Arellano is seeing Dr. Mallie Mussel.  Other iron deficiency anemia. Katherine Arellano is taking iron.   CBC Latest Ref Rng & Units 09/04/2019  WBC 3.4 - 10.8 x10E3/uL 4.9  Hemoglobin 11.1 - 15.9 g/dL 12.8  Hematocrit 34.0 - 46.6 % 37.6  Platelets 150 - 450 x10E3/uL 353   No results found for: IRON, TIBC, FERRITIN No results found for: VITAMINB12  At risk for activity intolerance. Katherine Arellano is at risk of exercise intolerance due to decreased iron and summer weather.  Assessment/Plan:   Essential hypertension.  Katherine Arellano is working on healthy weight loss and exercise to improve blood pressure control. We will watch for signs of hypotension as she continues her lifestyle modifications. Prescription was given for amLODipine (NORVASC) 10 MG tablet 1 PO daily #30 with 0 refills. Comprehensive metabolic panel will be checked today.  Vitamin D deficiency. Low Vitamin D level contributes to fatigue and are associated with obesity, breast, and colon cancer. She was given a prescription for Vitamin D, Ergocalciferol, (DRISDOL) 1.25 MG (50000 UNIT) CAPS capsule every week #4 with 0 refills and VITAMIN D 25 Hydroxy (Vit-D Deficiency, Fractures) level will be checked today.  Other depression, with emotional eating. Behavior modification techniques were discussed today to help Katherine Arellano deal with her emotional/non-hunger eating behaviors.  Orders and follow up as documented in patient record. Katherine Arellano will follow-up with CBT techniques.  Other iron deficiency anemia. Orders and follow up as documented in patient record. Katherine Arellano will continue her iron as directed. Anemia panel will be checked today.  Counseling . Iron is essential for our bodies to make red blood cells.  Reasons that someone may be deficient include: an iron-deficient diet (more likely in those following vegan or vegetarian diets), women with heavy menses, patients with GI disorders or poor absorption, patients that have had bariatric surgery, frequent blood donors, patients with cancer, and patients with heart disease.   Katherine Arellano Kitchen An iron supplement has been recommended. This is found over-the-counter.  Katherine Arellano foods include dark leafy greens, red and white meats, eggs, seafood, and beans.   Katherine Arellano Kitchen  Certain foods and drinks prevent your body from absorbing iron properly. Avoid eating these foods in the same meal as iron-rich foods or with iron supplements. These foods include: coffee, black tea, and red wine; milk, dairy products, and foods that are high in calcium; beans and  soybeans; whole grains.  . Constipation can be a side effect of iron supplementation. Increased water and fiber intake are helpful. Water goal: > 2 liters/day. Fiber goal: > 25 grams/day.   At risk for activity intolerance. Katherine Arellano was given approximately 15 minutes of exercise intolerance counseling today. She is 44 y.o. female and has risk factors exercise intolerance including obesity. We discussed intensive lifestyle modifications today with an emphasis on specific weight loss instructions and strategies. Katherine Arellano will slowly increase activity as tolerated.  Repetitive spaced learning was employed today to elicit superior memory formation and behavioral change.  Class 3 severe obesity with serious comorbidity and body mass index (BMI) of 45.0 to 49.9 in adult, unspecified obesity type (Katherine Arellano).  Katherine Arellano is currently in the action stage of change. As such, her goal is to continue with weight loss efforts. She has agreed to the Category 3 Plan.   She will work on meal planning, intentional eating, and increasing her protein at lunch.  Exercise goals: All adults should avoid inactivity. Some physical activity is better than none, and adults who participate in any amount of physical activity gain some health benefits.  Behavioral modification strategies: increasing lean protein intake, decreasing simple carbohydrates, increasing vegetables, increasing water intake, decreasing eating out, no skipping meals, meal planning and cooking strategies, keeping healthy foods in the home and planning for success.  Katherine Arellano has agreed to follow-up with our clinic in 2-3 weeks. She was informed of the importance of frequent follow-up visits to maximize her success with intensive lifestyle modifications for her multiple health conditions.   Katherine Arellano was informed we would discuss her lab results at her next visit unless there is a critical issue that needs to be addressed sooner. Katherine Arellano agreed to keep her next visit at the  agreed upon time to discuss these results.  Objective:   Blood pressure 134/78, pulse 74, temperature 98.5 F (36.9 C), height 5\' 2"  (1.575 m), weight 266 lb (120.7 kg), SpO2 100 %. Body mass index is 48.65 kg/m.  General: Cooperative, alert, well developed, in no acute distress. HEENT: Conjunctivae and lids unremarkable. Cardiovascular: Regular rhythm.  Lungs: Normal work of breathing. Neurologic: No focal deficits.   Lab Results  Component Value Date   CREATININE 0.94 09/04/2019   BUN 9 09/04/2019   NA 139 09/04/2019   K 4.0 09/04/2019   CL 102 09/04/2019   CO2 26 09/04/2019   Lab Results  Component Value Date   ALT 10 09/04/2019   AST 16 09/04/2019   ALKPHOS 53 09/04/2019   BILITOT 0.3 09/04/2019   Lab Results  Component Value Date   HGBA1C 4.6 (L) 09/04/2019   Lab Results  Component Value Date   INSULIN 7.3 09/04/2019   Lab Results  Component Value Date   TSH 1.080 09/04/2019   Lab Results  Component Value Date   CHOL 169 09/04/2019   HDL 45 09/04/2019   LDLCALC 112 (H) 09/04/2019   TRIG 61 09/04/2019   Lab Results  Component Value Date   WBC 4.9 09/04/2019   HGB 12.8 09/04/2019   HCT 37.6 09/04/2019   MCV 89 09/04/2019   PLT 353 09/04/2019   No results found for: IRON, TIBC, FERRITIN  Attestation Statements:   Reviewed by clinician on day of visit: allergies, medications, problem list, medical history, surgical history, family history, social history, and previous encounter notes.  [name], am acting as Location manager for [employer], DO   I have reviewed the above documentation for accuracy and completeness, and I agree with the above. Jearld Lesch, DO

## 2020-02-02 LAB — ANEMIA PANEL
Ferritin: 67 ng/mL (ref 15–150)
Folate, Hemolysate: 514 ng/mL
Folate, RBC: 1371 ng/mL (ref >498)
Hematocrit: 37.5 % (ref 34.0–46.6)
Iron Saturation: 40 % (ref 15–55)
Iron: 115 ug/dL (ref 27–159)
Retic Ct Pct: 1.4 % (ref 0.6–2.6)
Total Iron Binding Capacity: 289 ug/dL (ref 250–450)
UIBC: 174 ug/dL (ref 131–425)
Vitamin B-12: 782 pg/mL (ref 232–1245)

## 2020-02-02 LAB — COMPREHENSIVE METABOLIC PANEL
ALT: 5 IU/L (ref 0–32)
AST: 10 IU/L (ref 0–40)
Albumin/Globulin Ratio: 1.8 (ref 1.2–2.2)
Albumin: 4.4 g/dL (ref 3.8–4.8)
Alkaline Phosphatase: 44 IU/L — ABNORMAL LOW (ref 48–121)
BUN/Creatinine Ratio: 12 (ref 9–23)
BUN: 12 mg/dL (ref 6–24)
Bilirubin Total: 0.7 mg/dL (ref 0.0–1.2)
CO2: 22 mmol/L (ref 20–29)
Calcium: 8.9 mg/dL (ref 8.7–10.2)
Chloride: 104 mmol/L (ref 96–106)
Creatinine, Ser: 0.99 mg/dL (ref 0.57–1.00)
GFR calc Af Amer: 80 mL/min/{1.73_m2} (ref >59)
GFR calc non Af Amer: 69 mL/min/{1.73_m2} (ref >59)
Globulin, Total: 2.5 g/dL (ref 1.5–4.5)
Glucose: 75 mg/dL (ref 65–99)
Potassium: 4.1 mmol/L (ref 3.5–5.2)
Sodium: 138 mmol/L (ref 134–144)
Total Protein: 6.9 g/dL (ref 6.0–8.5)

## 2020-02-02 LAB — VITAMIN D 25 HYDROXY (VIT D DEFICIENCY, FRACTURES): Vit D, 25-Hydroxy: 44 ng/mL (ref 30.0–100.0)

## 2020-02-05 ENCOUNTER — Encounter (INDEPENDENT_AMBULATORY_CARE_PROVIDER_SITE_OTHER): Payer: Self-pay | Admitting: Bariatrics

## 2020-02-06 ENCOUNTER — Other Ambulatory Visit: Payer: Self-pay

## 2020-02-06 ENCOUNTER — Telehealth (INDEPENDENT_AMBULATORY_CARE_PROVIDER_SITE_OTHER): Payer: 59 | Admitting: Psychology

## 2020-02-06 DIAGNOSIS — F5089 Other specified eating disorder: Secondary | ICD-10-CM | POA: Diagnosis not present

## 2020-02-20 NOTE — Progress Notes (Signed)
Office: [phone]  /  Fax: [phone]    Date: March 05, 2020   Appointment Start Time: 8:32am Duration: + minutes Provider: Glennie Isle, Psy.D. Type of Session: Individual Therapy  Location of Patient: Home Location of Provider: Provider's Home Type of Contact: Telepsychological Visit via MyChart Video Visit  Session Content: Katherine Arellano is a 44 y.o. female presenting for a follow-up appointment to address the previously established treatment goal of increasing coping skills. Today's appointment was a telepsychological visit due to COVID-19. Katherine Arellano provided verbal consent for today's telepsychological appointment and she is aware she is responsible for securing confidentiality on her end of the session. Prior to proceeding with today's appointment, Katherine Arellano's physical location at the time of this appointment was obtained as well a phone number she could be reached at in the event of technical difficulties. Katherine Arellano and this provider participated in today's telepsychological service.   This provider conducted a brief check-in and verbally administered the PHQ-9 and GAD-7. Katherine Arellano shared about recent events, noting home renovations and desire to seek alternate employment. Regarding eating, Katherine Arellano acknowledged deviations from her meal plan due to being busy with work and renovations, but reported recent improvements. She denied engagement in emotional eating, adding increased awareness and engagement in learned skills has helped. Reduction in PHQ-9 and GAD-7 scores were reflected and discussed. Moreover, a plan was developed to help Katherine Arellano cope with emotional eating in the future using learned skills. She wrote down the following plan: focus on hydration; be prepared with snacks congruent to the meal plan; pause to ask questions when triggered to eat (e.g., Am I really hungry?, Is there something bothering me?, and Will I feel better if I eat?); and engage in discussed coping strategies after going through  the aforementioned questions. Overall, Katherine Arellano was receptive to today's appointment as evidenced by openness to sharing, responsiveness to feedback, and willingness to continue engaging in learned skills.  Mental Status Examination:  Appearance: well groomed and appropriate hygiene  Behavior: appropriate to circumstances Mood: euthymic Affect: mood congruent Speech: normal in rate, volume, and tone Eye Contact: appropriate Psychomotor Activity: appropriate Gait: unable to assess Thought Process: linear, logical, and goal directed  Thought Content/Perception: no hallucinations, delusions, bizarre thinking or behavior reported or observed and no evidence of suicidal and homicidal ideation, plan, and intent Orientation: time, person, place, and purpose of appointment Memory/Concentration: memory, attention, language, and fund of knowledge intact  Insight/Judgment: good  Structured Assessments Results: The Patient Health Questionnaire-9 (PHQ-9) is a self-report measure that assesses symptoms and severity of depression over the course of the last two weeks. Katherine Arellano obtained a score of 0. [0= Not at all; 1= Several days; 2= More than half the days; 3= Nearly every day] Little interest or pleasure in doing things 0  Feeling down, depressed, or hopeless 0  Trouble falling or staying asleep, or sleeping too much 0  Feeling tired or having little energy 0  Poor appetite or overeating 0  Feeling bad about yourself --- or that you are a failure or have let yourself or your family down 0  Trouble concentrating on things, such as reading the newspaper or watching television 0  Moving or speaking so slowly that other people could have noticed? Or the opposite --- being so fidgety or restless that you have been moving around a lot more than usual 0  Thoughts that you would be better off dead or hurting yourself in some way 0  PHQ-9 Score 0    The Generalized Anxiety  Disorder-7 (GAD-7) is a brief  self-report measure that assesses symptoms of anxiety over the course of the last two weeks. Katherine Arellano obtained a score of 1 suggesting minimal anxiety. Katherine Arellano finds the endorsed symptoms to be not difficult at all. [0= Not at all; 1= Several days; 2= Over half the days; 3= Nearly every day] Feeling nervous, anxious, on edge- due to home renovations  1  Not being able to stop or control worrying 0  Worrying too much about different things 0  Trouble relaxing 0  Being so restless that it's hard to sit still 0  Becoming easily annoyed or irritable 0  Feeling afraid as if something awful might happen 0  GAD-7 Score 1   Interventions:  Conducted a brief chart review Verbally administered PHQ-9 and GAD-7 for symptom monitoring Provided empathic reflections and validation Employed supportive psychotherapy interventions to facilitate reduced distress and to improve coping skills with identified stressors Reviewed learned skills  DSM-5 Diagnosis(es): 307.59 (F50.8) Other Specified Feeding or Eating Disorder, Emotional Eating Behaviors  Treatment Goal & Progress: During the initial appointment with this provider, the following treatment goal was established: increase coping skills. Katherine Arellano demonstrated progress in her goal as evidenced by increased awareness of hunger patterns and increased awareness of triggers for emotional eating. Katherine Arellano also continues to demonstrate willingness to engage in learned skill(s) and reported a reduction in emotional eating.   Plan: As previously planned, today was Katherine Arellano's last appointment with this provider. She acknowledged understanding that she may request a follow-up appointment with this provider in the future as long as she is still established with the clinic. No further follow-up planned by this provider.

## 2020-02-22 ENCOUNTER — Encounter (INDEPENDENT_AMBULATORY_CARE_PROVIDER_SITE_OTHER): Payer: Self-pay | Admitting: Bariatrics

## 2020-02-22 ENCOUNTER — Ambulatory Visit (INDEPENDENT_AMBULATORY_CARE_PROVIDER_SITE_OTHER): Payer: 59 | Admitting: Bariatrics

## 2020-02-22 ENCOUNTER — Other Ambulatory Visit: Payer: Self-pay

## 2020-02-22 VITALS — BP 113/73 | HR 77 | Temp 98.3°F | Ht 62.0 in | Wt 266.0 lb

## 2020-02-22 DIAGNOSIS — Z9189 Other specified personal risk factors, not elsewhere classified: Secondary | ICD-10-CM | POA: Diagnosis not present

## 2020-02-22 DIAGNOSIS — I10 Essential (primary) hypertension: Secondary | ICD-10-CM

## 2020-02-22 DIAGNOSIS — Z6841 Body Mass Index (BMI) 40.0 and over, adult: Secondary | ICD-10-CM

## 2020-02-22 DIAGNOSIS — E559 Vitamin D deficiency, unspecified: Secondary | ICD-10-CM

## 2020-02-22 MED ORDER — VITAMIN D (ERGOCALCIFEROL) 1.25 MG (50000 UNIT) PO CAPS: 50000.0000 [IU] | ORAL_CAPSULE | ORAL | 0 refills | Status: DC

## 2020-02-22 NOTE — Progress Notes (Signed)
Chief Complaint:   OBESITY Katherine Arellano is here to discuss her progress with her obesity treatment plan along with follow-up of her obesity related diagnoses. [name] is on the Category 3 Plan and states she is following her eating plan approximately 20% of the time. [name] states she is doing cardio/strengthening 3 hours 1 time per week.  Today's visit was #: 10 Starting weight: 276 lbs Starting date: 09/04/2019 Today's weight: 266 lbs Today's date: 02/22/2020 Total lbs lost to date: 10 Total lbs lost since last in-office visit: 0  Interim History: Katherine Arellano's weight remains the same since her last visit. She has been struggling with work.  Subjective:   Vitamin D deficiency. No nausea, vomiting, or muscle weakness.    Ref. Range 02/01/2020 09:38  Vitamin D, 25-Hydroxy Latest Ref Range: 30.0 - 100.0 ng/mL 44.0   Essential hypertension. Toshie is taking Norvasc.  BP Readings from Last 3 Encounters:  02/22/20 113/73  02/01/20 134/78  01/11/20 119/76   Lab Results  Component Value Date   CREATININE 0.99 02/01/2020   CREATININE 0.94 09/04/2019   At risk for osteoporosis. Katherine Arellano is at higher risk of osteopenia and osteoporosis due to Vitamin D deficiency.   Assessment/Plan:   Vitamin D deficiency. Low Vitamin D level contributes to fatigue and are associated with obesity, breast, and colon cancer. She was given a prescription for Vitamin D, Ergocalciferol, (DRISDOL) 1.25 MG (50000 UNIT) CAPS capsule every week #4 with 0 refills and will follow-up for routine testing of Vitamin D, at least 2-3 times per year to avoid over-replacement.   Essential hypertension. Symphany is working on healthy weight loss and exercise to improve blood pressure control. We will watch for signs of hypotension as she continues her lifestyle modifications. She will continue her medication as directed.   At risk for osteoporosis. Katherine Arellano was given approximately 5 minutes of osteoporosis prevention counseling  today. Katherine Arellano is at risk for osteopenia and osteoporosis due to her Vitamin D deficiency. She was encouraged to take her Vitamin D and follow her higher calcium diet and increase strengthening exercise to help strengthen her bones and decrease her risk of osteopenia and osteoporosis.  Repetitive spaced learning was employed today to elicit superior memory formation and behavioral change.  Class 3 severe obesity with serious comorbidity and body mass index (BMI) of 45.0 to 49.9 in adult, unspecified obesity type ([place]).  Katherine Arellano is currently in the action stage of change. As such, her goal is to continue with weight loss efforts. She has agreed to the Category 3 Plan.   She will work on meal planning, intentional eating, and will try to prepare lunch and eat on time.  We reviewed with the patient labs from 02/01/2020 including CBC, lipids, and iron panel.  Exercise goals: Katherine Arellano will continue her current exercise regimen of working out.  Behavioral modification strategies: increasing lean protein intake, decreasing simple carbohydrates, increasing vegetables, increasing water intake, decreasing eating out, no skipping meals, meal planning and cooking strategies, keeping healthy foods in the home and planning for success.  Katherine Arellano has agreed to follow-up with our clinic in 2-3 weeks. She was informed of the importance of frequent follow-up visits to maximize her success with intensive lifestyle modifications for her multiple health conditions.   Objective:   Blood pressure 113/73, pulse 77, temperature 98.3 F (36.8 C), height 5\' 2"  (1.575 m), weight 266 lb (120.7 kg), SpO2 100 %. Body mass index is 48.65 kg/m.  General: Cooperative, alert, well developed, in  no acute distress. HEENT: Conjunctivae and lids unremarkable. Cardiovascular: Regular rhythm.  Lungs: Normal work of breathing. Neurologic: No focal deficits.   Lab Results  Component Value Date   CREATININE 0.99 02/01/2020   BUN 12  02/01/2020   NA 138 02/01/2020   K 4.1 02/01/2020   CL 104 02/01/2020   CO2 22 02/01/2020   Lab Results  Component Value Date   ALT 5 02/01/2020   AST 10 02/01/2020   ALKPHOS 44 (L) 02/01/2020   BILITOT 0.7 02/01/2020   Lab Results  Component Value Date   HGBA1C 4.6 (L) 09/04/2019   Lab Results  Component Value Date   INSULIN 7.3 09/04/2019   Lab Results  Component Value Date   TSH 1.080 09/04/2019   Lab Results  Component Value Date   CHOL 169 09/04/2019   HDL 45 09/04/2019   LDLCALC 112 (H) 09/04/2019   TRIG 61 09/04/2019   Lab Results  Component Value Date   WBC 4.9 09/04/2019   HGB 12.8 09/04/2019   HCT 37.5 02/01/2020   MCV 89 09/04/2019   PLT 353 09/04/2019   Lab Results  Component Value Date   IRON 115 02/01/2020   TIBC 289 02/01/2020   FERRITIN 67 02/01/2020   Attestation Statements:   Reviewed by clinician on day of visit: allergies, medications, problem list, medical history, surgical history, family history, social history, and previous encounter notes.  [name], am acting as Location manager for [employer], DO   I have reviewed the above documentation for accuracy and completeness, and I agree with the above. Jearld Lesch, DO

## 2020-03-05 ENCOUNTER — Other Ambulatory Visit: Payer: Self-pay

## 2020-03-05 ENCOUNTER — Telehealth (INDEPENDENT_AMBULATORY_CARE_PROVIDER_SITE_OTHER): Payer: 59 | Admitting: Psychology

## 2020-03-05 DIAGNOSIS — F5089 Other specified eating disorder: Secondary | ICD-10-CM | POA: Diagnosis not present

## 2020-03-14 ENCOUNTER — Encounter (INDEPENDENT_AMBULATORY_CARE_PROVIDER_SITE_OTHER): Payer: Self-pay | Admitting: Bariatrics

## 2020-03-14 ENCOUNTER — Ambulatory Visit (INDEPENDENT_AMBULATORY_CARE_PROVIDER_SITE_OTHER): Payer: 59 | Admitting: Bariatrics

## 2020-03-14 ENCOUNTER — Other Ambulatory Visit: Payer: Self-pay

## 2020-03-14 VITALS — BP 102/62 | HR 80 | Temp 98.3°F | Ht 62.0 in | Wt 265.0 lb

## 2020-03-14 DIAGNOSIS — Z6841 Body Mass Index (BMI) 40.0 and over, adult: Secondary | ICD-10-CM | POA: Diagnosis not present

## 2020-03-14 DIAGNOSIS — I10 Essential (primary) hypertension: Secondary | ICD-10-CM | POA: Diagnosis not present

## 2020-03-14 DIAGNOSIS — Z9189 Other specified personal risk factors, not elsewhere classified: Secondary | ICD-10-CM | POA: Diagnosis not present

## 2020-03-14 DIAGNOSIS — E559 Vitamin D deficiency, unspecified: Secondary | ICD-10-CM

## 2020-03-14 MED ORDER — AMLODIPINE BESYLATE 10 MG PO TABS: 10.0000 mg | ORAL_TABLET | Freq: Every day | ORAL | 0 refills | Status: DC

## 2020-03-14 MED ORDER — VITAMIN D (ERGOCALCIFEROL) 1.25 MG (50000 UNIT) PO CAPS: 50000.0000 [IU] | ORAL_CAPSULE | ORAL | 0 refills | Status: DC

## 2020-03-19 ENCOUNTER — Encounter (INDEPENDENT_AMBULATORY_CARE_PROVIDER_SITE_OTHER): Payer: Self-pay | Admitting: Bariatrics

## 2020-03-19 NOTE — Progress Notes (Signed)
Chief Complaint:   OBESITY Katherine Arellano is here to discuss her progress with her obesity treatment plan along with follow-up of her obesity related diagnoses. [name] is on the Category 3 Plan and states she is following her eating plan approximately 10% of the time. Betania states she is doing cardio and weights 3 hours 1 time per week.   Today's visit was #: 11 Starting weight: 276 lbs Starting date: 09/04/2019 Today's weight: 265 lbs Today's date: 03/14/2020 Total lbs lost to date: 11 Total lbs lost since last in-office visit: 1  Interim History: Katherine Arellano is down 1 lb since her last visit. She states that she has been doing home renovations and meal planning has not been consistent.  Subjective:   Essential hypertension. Blood pressure is controlled.  BP Readings from Last 3 Encounters:  03/14/20 102/62  02/22/20 113/73  02/01/20 134/78   Lab Results  Component Value Date   CREATININE 0.99 02/01/2020   CREATININE 0.94 09/04/2019   Vitamin D deficiency. Katherine Arellano reports limited sunlight exposure.   Ref. Range 02/01/2020 09:38  Vitamin D, 25-Hydroxy Latest Ref Range: 30.0 - 100.0 ng/mL 44.0   At risk for heart disease. Katherine Arellano is at a higher than average risk for cardiovascular disease due to hypertension and obesity.   Assessment/Plan:   Essential hypertension. Briane is working on healthy weight loss and exercise to improve blood pressure control. We will watch for signs of hypotension as she continues her lifestyle modifications. Prescription was given for amLODipine (NORVASC) 10 MG tablet 1 PO daily #30 with 0 refills.  Vitamin D deficiency. Low Vitamin D level contributes to fatigue and are associated with obesity, breast, and colon cancer. She was given a prescription for Vitamin D, Ergocalciferol, (DRISDOL) 1.25 MG (50000 UNIT) CAPS capsule every week #4 with 0 refills and will follow-up for routine testing of Vitamin D, at least 2-3 times per year to avoid over-replacement.    At risk for heart disease. Katherine Arellano was given approximately 15 minutes of coronary artery disease prevention counseling today. She is 44 y.o. female and has risk factors for heart disease including obesity. We discussed intensive lifestyle modifications today with an emphasis on specific weight loss instructions and strategies.   Repetitive spaced learning was employed today to elicit superior memory formation and behavioral change.  Class 3 severe obesity with serious comorbidity and body mass index (BMI) of 45.0 to 49.9 in adult, unspecified obesity type ([place]).  Katherine Arellano is currently in the action stage of change. As such, her goal is to continue with weight loss efforts. She has agreed to the Category 3 Plan.   She will work on meal planning, mindful eating, adhering more closely to the plan, and increasing her water intake.  Exercise goals: Katherine Arellano will continue her current exercise regimen.   Behavioral modification strategies: increasing lean protein intake, decreasing simple carbohydrates, increasing vegetables, increasing water intake, decreasing eating out, no skipping meals, meal planning and cooking strategies, keeping healthy foods in the home and planning for success.  Katherine Arellano has agreed to follow-up with our clinic in 2 weeks. She was informed of the importance of frequent follow-up visits to maximize her success with intensive lifestyle modifications for her multiple health conditions.   Objective:   Blood pressure 102/62, pulse 80, temperature 98.3 F (36.8 C), height 5\' 2"  (1.575 m), weight 265 lb (120.2 kg), SpO2 95 %. Body mass index is 48.47 kg/m.  General: Cooperative, alert, well developed, in no acute distress. HEENT: Conjunctivae  and lids unremarkable. Cardiovascular: Regular rhythm.  Lungs: Normal work of breathing. Neurologic: No focal deficits.   Lab Results  Component Value Date   CREATININE 0.99 02/01/2020   BUN 12 02/01/2020   NA 138 02/01/2020   K 4.1  02/01/2020   CL 104 02/01/2020   CO2 22 02/01/2020   Lab Results  Component Value Date   ALT 5 02/01/2020   AST 10 02/01/2020   ALKPHOS 44 (L) 02/01/2020   BILITOT 0.7 02/01/2020   Lab Results  Component Value Date   HGBA1C 4.6 (L) 09/04/2019   Lab Results  Component Value Date   INSULIN 7.3 09/04/2019   Lab Results  Component Value Date   TSH 1.080 09/04/2019   Lab Results  Component Value Date   CHOL 169 09/04/2019   HDL 45 09/04/2019   LDLCALC 112 (H) 09/04/2019   TRIG 61 09/04/2019   Lab Results  Component Value Date   WBC 4.9 09/04/2019   HGB 12.8 09/04/2019   HCT 37.5 02/01/2020   MCV 89 09/04/2019   PLT 353 09/04/2019   Lab Results  Component Value Date   IRON 115 02/01/2020   TIBC 289 02/01/2020   FERRITIN 67 02/01/2020   Attestation Statements:   Reviewed by clinician on day of visit: allergies, medications, problem list, medical history, surgical history, family history, social history, and previous encounter notes.  [name], am acting as Location manager for [employer], DO   I have reviewed the above documentation for accuracy and completeness, and I agree with the above. Jearld Lesch, DO

## 2020-03-28 ENCOUNTER — Other Ambulatory Visit: Payer: Self-pay

## 2020-03-28 ENCOUNTER — Ambulatory Visit (INDEPENDENT_AMBULATORY_CARE_PROVIDER_SITE_OTHER): Payer: 59 | Admitting: Bariatrics

## 2020-03-28 ENCOUNTER — Encounter (INDEPENDENT_AMBULATORY_CARE_PROVIDER_SITE_OTHER): Payer: Self-pay | Admitting: Bariatrics

## 2020-03-28 VITALS — BP 125/81 | HR 77 | Temp 98.4°F | Ht 62.0 in | Wt 265.0 lb

## 2020-03-28 DIAGNOSIS — E559 Vitamin D deficiency, unspecified: Secondary | ICD-10-CM | POA: Diagnosis not present

## 2020-03-28 DIAGNOSIS — Z6841 Body Mass Index (BMI) 40.0 and over, adult: Secondary | ICD-10-CM | POA: Diagnosis not present

## 2020-03-28 DIAGNOSIS — I10 Essential (primary) hypertension: Secondary | ICD-10-CM

## 2020-04-08 ENCOUNTER — Encounter (INDEPENDENT_AMBULATORY_CARE_PROVIDER_SITE_OTHER): Payer: Self-pay | Admitting: Bariatrics

## 2020-04-08 NOTE — Progress Notes (Signed)
Chief Complaint:   OBESITY Katherine Arellano is here to discuss her progress with her obesity treatment plan along with follow-up of her obesity related diagnoses. Katherine Arellano is on the Category 3 Plan and states she is following her eating plan approximately 0% of the time. Katherine Arellano states she is doing cardio and strength training 120 minutes 3 times per week.  Today's visit was #: 12 Starting weight: 276 lbs Starting date: 09/04/2019 Today's weight: 265 lbs Today's date: 03/28/2020 Total lbs lost to date: 11 Total lbs lost since last in-office visit: 0  Interim History: Katherine Arellano's weight remains the same. She has been doing renovations. She reports drinking adequate water.  Subjective:   Vitamin D deficiency. Katherine Arellano is taking Vitamin D supplementation.    Ref. Range 02/01/2020 09:38  Vitamin D, 25-Hydroxy Latest Ref Range: 30.0 - 100.0 ng/mL 44.0   Essential hypertension. Katherine Arellano is taking Norvasc. Blood pressure today is 125/81.  BP Readings from Last 3 Encounters:  03/28/20 125/81  03/14/20 102/62  02/22/20 113/73   Lab Results  Component Value Date   CREATININE 0.99 02/01/2020   CREATININE 0.94 09/04/2019    Assessment/Plan:   Vitamin D deficiency. Low Vitamin D level contributes to fatigue and are associated with obesity, breast, and colon cancer. She agrees to continue to take Vitamin D as directed and will follow-up for routine testing of Vitamin D, at least 2-3 times per year to avoid over-replacement.  Essential hypertension. Katherine Arellano is working on healthy weight loss and exercise to improve blood pressure control. We will watch for signs of hypotension as she continues her lifestyle modifications. She will continue her medication as directed.   Class 3 severe obesity with serious comorbidity and body mass index (BMI) of 45.0 to 49.9 in adult, unspecified obesity type (Katherine Arellano).  Katherine Arellano is currently in the action stage of change. As such, her goal is to continue with weight loss  efforts. She has agreed to the Category 3 Plan.   She will work on meal planning, intentional eating, increasing her protein intake, and making better choices.  Exercise goals: Katherine Arellano will decrease exercise due to leg pain, improving now.  Behavioral modification strategies: increasing lean protein intake, decreasing simple carbohydrates, increasing vegetables, increasing water intake, decreasing eating out, no skipping meals, meal planning and cooking strategies, keeping healthy foods in the home and planning for success.  Katherine Arellano has agreed to follow-up with our clinic in 2 weeks. She was informed of the importance of frequent follow-up visits to maximize her success with intensive lifestyle modifications for her multiple health conditions.   Objective:   Blood pressure 125/81, pulse 77, temperature 98.4 F (36.9 C), height 5\' 2"  (1.575 m), weight 265 lb (120.2 kg), SpO2 97 %. Body mass index is 48.47 kg/m.  General: Cooperative, alert, well developed, in no acute distress. HEENT: Conjunctivae and lids unremarkable. Cardiovascular: Regular rhythm.  Lungs: Normal work of breathing. Neurologic: No focal deficits.   Lab Results  Component Value Date   CREATININE 0.99 02/01/2020   BUN 12 02/01/2020   NA 138 02/01/2020   K 4.1 02/01/2020   CL 104 02/01/2020   CO2 22 02/01/2020   Lab Results  Component Value Date   ALT 5 02/01/2020   AST 10 02/01/2020   ALKPHOS 44 (L) 02/01/2020   BILITOT 0.7 02/01/2020   Lab Results  Component Value Date   HGBA1C 4.6 (L) 09/04/2019   Lab Results  Component Value Date   INSULIN 7.3 09/04/2019  Lab Results  Component Value Date   TSH 1.080 09/04/2019   Lab Results  Component Value Date   CHOL 169 09/04/2019   HDL 45 09/04/2019   LDLCALC 112 (H) 09/04/2019   TRIG 61 09/04/2019   Lab Results  Component Value Date   WBC 4.9 09/04/2019   HGB 12.8 09/04/2019   HCT 37.5 02/01/2020   MCV 89 09/04/2019   PLT 353 09/04/2019   Lab  Results  Component Value Date   IRON 115 02/01/2020   TIBC 289 02/01/2020   FERRITIN 67 02/01/2020   Attestation Statements:   Reviewed by clinician on day of visit: allergies, medications, problem list, medical history, surgical history, family history, social history, and previous encounter notes.  Time spent on visit including pre-visit chart review and post-visit charting and care was 20 minutes.   [name], am acting as Location manager for [employer], DO   I have reviewed the above documentation for accuracy and completeness, and I agree with the above. Jearld Lesch, DO

## 2020-04-15 ENCOUNTER — Encounter (INDEPENDENT_AMBULATORY_CARE_PROVIDER_SITE_OTHER): Payer: Self-pay | Admitting: Bariatrics

## 2020-04-15 ENCOUNTER — Ambulatory Visit (INDEPENDENT_AMBULATORY_CARE_PROVIDER_SITE_OTHER): Payer: 59 | Admitting: Bariatrics

## 2020-04-15 ENCOUNTER — Other Ambulatory Visit: Payer: Self-pay

## 2020-04-15 VITALS — BP 132/86 | HR 92 | Temp 99.2°F | Ht 62.0 in | Wt 268.0 lb

## 2020-04-15 DIAGNOSIS — I10 Essential (primary) hypertension: Secondary | ICD-10-CM | POA: Diagnosis not present

## 2020-04-15 DIAGNOSIS — Z9189 Other specified personal risk factors, not elsewhere classified: Secondary | ICD-10-CM

## 2020-04-15 DIAGNOSIS — Z6841 Body Mass Index (BMI) 40.0 and over, adult: Secondary | ICD-10-CM | POA: Diagnosis not present

## 2020-04-15 DIAGNOSIS — E559 Vitamin D deficiency, unspecified: Secondary | ICD-10-CM | POA: Diagnosis not present

## 2020-04-15 MED ORDER — VITAMIN D (ERGOCALCIFEROL) 1.25 MG (50000 UNIT) PO CAPS: 50000.0000 [IU] | ORAL_CAPSULE | ORAL | 0 refills | Status: DC

## 2020-04-15 MED ORDER — AMLODIPINE BESYLATE 10 MG PO TABS: 10.0000 mg | ORAL_TABLET | Freq: Every day | ORAL | 0 refills | Status: DC

## 2020-04-17 ENCOUNTER — Encounter (INDEPENDENT_AMBULATORY_CARE_PROVIDER_SITE_OTHER): Payer: Self-pay | Admitting: Bariatrics

## 2020-04-17 NOTE — Progress Notes (Signed)
Chief Complaint:   OBESITY Katherine Arellano is here to discuss her progress with her obesity treatment plan along with follow-up of her obesity related diagnoses. Katherine Arellano is on the Category 3 Plan and states she is following her eating plan approximately 0% of the time. Katherine Arellano states she is doing cardio and strengthening 3 hours 1 time a week and cardio 45 minutes 2 times per week.  Today's visit was #: 54 Starting weight: 276 lbs Starting date: 09/04/2019 Today's weight: 268 lbs Today's date: 04/15/2020 Total lbs lost to date: 8 Total lbs lost since last in-office visit: 0  Interim History: Katherine Arellano is up 3 lbs since her last visit, but she has been doing a lot of baking.  Subjective:   Essential hypertension. Blood pressure is reasonably well controlled.  BP Readings from Last 3 Encounters:  04/15/20 132/86  03/28/20 125/81  03/14/20 102/62   Lab Results  Component Value Date   CREATININE 0.99 02/01/2020   CREATININE 0.94 09/04/2019   Vitamin D deficiency. No nausea, vomiting, or muscle weakness.    Ref. Range 02/01/2020 09:38  Vitamin D, 25-Hydroxy Latest Ref Range: 30.0 - 100.0 ng/mL 44.0   At risk for dehydration. Katherine Arellano is at increased risk for dehydration due to exercise.  Assessment/Plan:   Essential hypertension. Maigan is working on healthy weight loss and exercise to improve blood pressure control. We will watch for signs of hypotension as she continues her lifestyle modifications. Prescription was given for amLODipine (NORVASC) 10 MG tablet 1 daily #30 with 0 refills.  Vitamin D deficiency. Low Vitamin D level contributes to fatigue and are associated with obesity, breast, and colon cancer. She was given a prescription for Vitamin D, Ergocalciferol, (DRISDOL) 1.25 MG (50000 UNIT) CAPS capsule every week #4 with 0 refills and will follow-up for routine testing of Vitamin D, at least 2-3 times per year to avoid over-replacement.   At risk for dehydration. Katherine Arellano was given  approximately 15 minutes dehydration prevention counseling today. Katherine Arellano is at risk for dehydration due to weight loss and current medication(s). She was encouraged to hydrate and monitor fluid status to avoid dehydration as well as weight loss plateaus.   Class 3 severe obesity with serious comorbidity and body mass index (BMI) of 45.0 to 49.9 in adult, unspecified obesity type (Katherine Arellano).  Katherine Arellano is currently in the action stage of change. As such, her goal is to continue with weight loss efforts. She has agreed to the Category 3 Plan.    She will work on meal planning, intentional eating, increasing her protein, and increasing her adherence to the plan.   She will stop baking and doing other volunteer work.  Exercise goals: Katherine Arellano will continue her current exercise regimen.   Behavioral modification strategies: increasing lean protein intake, decreasing simple carbohydrates, increasing vegetables, increasing water intake, decreasing eating out, no skipping meals, meal planning and cooking strategies, keeping healthy foods in the home, dealing with family or coworker sabotage, travel eating strategies, holiday eating strategies  and celebration eating strategies.  Katherine Arellano has agreed to follow-up with our clinic in 2 weeks. She was informed of the importance of frequent follow-up visits to maximize her success with intensive lifestyle modifications for her multiple health conditions.   Objective:   Blood pressure 132/86, pulse 92, temperature 99.2 F (37.3 C), height 5\' 2"  (1.575 m), weight 268 lb (121.6 kg), last menstrual period 03/25/2020, SpO2 98 %. Body mass index is 49.02 kg/m.  General: Cooperative, alert, well developed, in  no acute distress. HEENT: Conjunctivae and lids unremarkable. Cardiovascular: Regular rhythm.  Lungs: Normal work of breathing. Neurologic: No focal deficits.   Lab Results  Component Value Date   CREATININE 0.99 02/01/2020   BUN 12 02/01/2020   NA 138 02/01/2020     K 4.1 02/01/2020   CL 104 02/01/2020   CO2 22 02/01/2020   Lab Results  Component Value Date   ALT 5 02/01/2020   AST 10 02/01/2020   ALKPHOS 44 (L) 02/01/2020   BILITOT 0.7 02/01/2020   Lab Results  Component Value Date   HGBA1C 4.6 (L) 09/04/2019   Lab Results  Component Value Date   INSULIN 7.3 09/04/2019   Lab Results  Component Value Date   TSH 1.080 09/04/2019   Lab Results  Component Value Date   CHOL 169 09/04/2019   HDL 45 09/04/2019   LDLCALC 112 (H) 09/04/2019   TRIG 61 09/04/2019   Lab Results  Component Value Date   WBC 4.9 09/04/2019   HGB 12.8 09/04/2019   HCT 37.5 02/01/2020   MCV 89 09/04/2019   PLT 353 09/04/2019   Lab Results  Component Value Date   IRON 115 02/01/2020   TIBC 289 02/01/2020   FERRITIN 67 02/01/2020   Attestation Statements:   Reviewed by clinician on day of visit: allergies, medications, problem list, medical history, surgical history, family history, social history, and previous encounter notes.  [name], am acting as Location manager for [employer], DO   I have reviewed the above documentation for accuracy and completeness, and I agree with the above. Jearld Lesch, DO

## 2020-05-07 ENCOUNTER — Ambulatory Visit (INDEPENDENT_AMBULATORY_CARE_PROVIDER_SITE_OTHER): Payer: 59 | Admitting: Bariatrics

## 2020-05-13 ENCOUNTER — Other Ambulatory Visit: Payer: Self-pay

## 2020-05-13 ENCOUNTER — Ambulatory Visit (INDEPENDENT_AMBULATORY_CARE_PROVIDER_SITE_OTHER): Payer: 59 | Admitting: Bariatrics

## 2020-05-13 ENCOUNTER — Encounter (INDEPENDENT_AMBULATORY_CARE_PROVIDER_SITE_OTHER): Payer: Self-pay | Admitting: Bariatrics

## 2020-05-13 VITALS — BP 141/65 | HR 75 | Temp 98.3°F | Ht 62.0 in | Wt 271.0 lb

## 2020-05-13 DIAGNOSIS — I10 Essential (primary) hypertension: Secondary | ICD-10-CM

## 2020-05-13 DIAGNOSIS — Z6841 Body Mass Index (BMI) 40.0 and over, adult: Secondary | ICD-10-CM

## 2020-05-13 DIAGNOSIS — E559 Vitamin D deficiency, unspecified: Secondary | ICD-10-CM | POA: Diagnosis not present

## 2020-05-13 DIAGNOSIS — Z9189 Other specified personal risk factors, not elsewhere classified: Secondary | ICD-10-CM

## 2020-05-13 MED ORDER — VITAMIN D (ERGOCALCIFEROL) 1.25 MG (50000 UNIT) PO CAPS: 50000.0000 [IU] | ORAL_CAPSULE | ORAL | 0 refills | Status: DC

## 2020-05-13 MED ORDER — AMLODIPINE BESYLATE 10 MG PO TABS: 10.0000 mg | ORAL_TABLET | Freq: Every day | ORAL | 0 refills | Status: DC

## 2020-05-14 ENCOUNTER — Encounter (INDEPENDENT_AMBULATORY_CARE_PROVIDER_SITE_OTHER): Payer: Self-pay | Admitting: Bariatrics

## 2020-05-14 NOTE — Progress Notes (Signed)
Chief Complaint:   OBESITY Katherine Arellano is here to discuss her progress with her obesity treatment plan along with follow-up of her obesity related diagnoses. Katherine Arellano is on the Category 3 Plan and states she is following her eating plan approximately 0% of the time. Katherine Arellano states she is exercising 0 minutes 0 times per week.  Today's visit was #: 14 Starting weight: 276 lbs Starting date: 03/08//2021 Today's weight: 271 lbs Today's date: 05/13/2020 Total lbs lost to date: 5 Total lbs lost since last in-office visit: 0  Interim History: Katherine Arellano is up 3 lbs, but states that she injured herself after a workout (back of the legs) and will see an orthopedist.  Subjective:   Essential hypertension. Blood pressure is controlled.  BP Readings from Last 3 Encounters:  05/13/20 (!) 141/65  04/15/20 132/86  03/28/20 125/81   Lab Results  Component Value Date   CREATININE 0.99 02/01/2020   CREATININE 0.94 09/04/2019   Vitamin D deficiency. No nausea, vomiting, or muscle weakness.    Ref. Range 02/01/2020 09:38  Vitamin D, 25-Hydroxy Latest Ref Range: 30.0 - 100.0 ng/mL 44.0   At risk for heart disease. Katherine Arellano is at a higher than average risk for cardiovascular disease due to hypertension.   Assessment/Plan:   Essential hypertension. Katherine Arellano is working on healthy weight loss and exercise to improve blood pressure control. We will watch for signs of hypotension as she continues her lifestyle modifications. Prescription was given for amLODipine (NORVASC) 10 MG tablet 1 daily #30 with 0 refills.  Vitamin D deficiency. Low Vitamin D level contributes to fatigue and are associated with obesity, breast, and colon cancer. She was given a prescription for Vitamin D, Ergocalciferol, (DRISDOL) 1.25 MG (50000 UNIT) CAPS capsule every week #4 with 0 refills and will follow-up for routine testing of Vitamin D, at least 2-3 times per year to avoid over-replacement.   At risk for heart disease. Katherine Arellano was  given approximately 15 minutes of coronary artery disease prevention counseling today. She is 44 y.o. female and has risk factors for heart disease including obesity. We discussed intensive lifestyle modifications today with an emphasis on specific weight loss instructions and strategies.   Repetitive spaced learning was employed today to elicit superior memory formation and behavioral change.  Class 3 severe obesity with serious comorbidity and body mass index (BMI) of 45.0 to 49.9 in adult, unspecified obesity type (Katherine Arellano).  Katherine Arellano is currently in the action stage of change. As such, her goal is to continue with weight loss efforts. She has agreed to the Category 3 Plan.   She will work on meal planning and mindful eating.  Exercise goals: Katherine Arellano will meet with the orthopedist today.  Behavioral modification strategies: increasing lean protein intake, decreasing simple carbohydrates, increasing vegetables, increasing water intake, decreasing eating out, no skipping meals, meal planning and cooking strategies, keeping healthy foods in the home and planning for success.  Katherine Arellano has agreed to follow-up with our clinic in 4 weeks. She was informed of the importance of frequent follow-up visits to maximize her success with intensive lifestyle modifications for her multiple health conditions.   Objective:   Blood pressure (!) 141/65, pulse 75, temperature 98.3 F (36.8 C), height 5\' 2"  (1.575 m), weight 271 lb (122.9 kg), last menstrual period 04/22/2020, SpO2 98 %. Body mass index is 49.57 kg/m.  General: Cooperative, alert, well developed, in no acute distress. HEENT: Conjunctivae and lids unremarkable. Cardiovascular: Regular rhythm.  Lungs: Normal work of breathing.  Neurologic: No focal deficits.   Lab Results  Component Value Date   CREATININE 0.99 02/01/2020   BUN 12 02/01/2020   NA 138 02/01/2020   K 4.1 02/01/2020   CL 104 02/01/2020   CO2 22 02/01/2020   Lab Results  Component  Value Date   ALT 5 02/01/2020   AST 10 02/01/2020   ALKPHOS 44 (L) 02/01/2020   BILITOT 0.7 02/01/2020   Lab Results  Component Value Date   HGBA1C 4.6 (L) 09/04/2019   Lab Results  Component Value Date   INSULIN 7.3 09/04/2019   Lab Results  Component Value Date   TSH 1.080 09/04/2019   Lab Results  Component Value Date   CHOL 169 09/04/2019   HDL 45 09/04/2019   LDLCALC 112 (H) 09/04/2019   TRIG 61 09/04/2019   Lab Results  Component Value Date   WBC 4.9 09/04/2019   HGB 12.8 09/04/2019   HCT 37.5 02/01/2020   MCV 89 09/04/2019   PLT 353 09/04/2019   Lab Results  Component Value Date   IRON 115 02/01/2020   TIBC 289 02/01/2020   FERRITIN 67 02/01/2020   Attestation Statements:   Reviewed by clinician on day of visit: allergies, medications, problem list, medical history, surgical history, family history, social history, and previous encounter notes.  [name], am acting as Location manager for [employer], DO   I have reviewed the above documentation for accuracy and completeness, and I agree with the above. Jearld Lesch, DO

## 2020-06-09 ENCOUNTER — Other Ambulatory Visit (INDEPENDENT_AMBULATORY_CARE_PROVIDER_SITE_OTHER): Payer: Self-pay | Admitting: Bariatrics

## 2020-06-09 DIAGNOSIS — I10 Essential (primary) hypertension: Secondary | ICD-10-CM

## 2020-06-10 ENCOUNTER — Other Ambulatory Visit (INDEPENDENT_AMBULATORY_CARE_PROVIDER_SITE_OTHER): Payer: Self-pay | Admitting: Bariatrics

## 2020-06-10 ENCOUNTER — Ambulatory Visit (INDEPENDENT_AMBULATORY_CARE_PROVIDER_SITE_OTHER): Payer: 59 | Admitting: Bariatrics

## 2020-06-10 DIAGNOSIS — I10 Essential (primary) hypertension: Secondary | ICD-10-CM

## 2020-06-10 NOTE — Telephone Encounter (Signed)
This patient was last seen by Dr. Owens Shark, and currently has an upcoming appt scheduled on 06/10/20 with her.

## 2020-06-11 ENCOUNTER — Other Ambulatory Visit (INDEPENDENT_AMBULATORY_CARE_PROVIDER_SITE_OTHER): Payer: Self-pay | Admitting: Bariatrics

## 2020-06-11 DIAGNOSIS — I10 Essential (primary) hypertension: Secondary | ICD-10-CM

## 2020-06-11 DIAGNOSIS — E559 Vitamin D deficiency, unspecified: Secondary | ICD-10-CM

## 2020-06-11 NOTE — Telephone Encounter (Signed)
This patient was last seen by Dr. Owens Shark, and currently does not have an upcoming scheduled appt.

## 2020-06-12 ENCOUNTER — Other Ambulatory Visit (INDEPENDENT_AMBULATORY_CARE_PROVIDER_SITE_OTHER): Payer: Self-pay | Admitting: Bariatrics

## 2020-06-12 DIAGNOSIS — I10 Essential (primary) hypertension: Secondary | ICD-10-CM

## 2020-06-12 DIAGNOSIS — E559 Vitamin D deficiency, unspecified: Secondary | ICD-10-CM

## 2020-06-12 MED ORDER — AMLODIPINE BESYLATE 10 MG PO TABS: 10.0000 mg | ORAL_TABLET | Freq: Every day | ORAL | 0 refills | Status: DC

## 2020-06-12 MED ORDER — VITAMIN D (ERGOCALCIFEROL) 1.25 MG (50000 UNIT) PO CAPS: 50000.0000 [IU] | ORAL_CAPSULE | ORAL | 0 refills | Status: DC

## 2020-06-12 NOTE — Telephone Encounter (Signed)
Refill request and pt has upcoming appt

## 2020-07-01 ENCOUNTER — Ambulatory Visit (INDEPENDENT_AMBULATORY_CARE_PROVIDER_SITE_OTHER): Payer: 59 | Admitting: Bariatrics

## 2020-07-01 ENCOUNTER — Encounter (INDEPENDENT_AMBULATORY_CARE_PROVIDER_SITE_OTHER): Payer: Self-pay | Admitting: Bariatrics

## 2020-07-01 ENCOUNTER — Other Ambulatory Visit: Payer: Self-pay

## 2020-07-01 VITALS — BP 118/82 | HR 85 | Temp 98.7°F | Ht 62.0 in | Wt 275.0 lb

## 2020-07-01 DIAGNOSIS — Z9189 Other specified personal risk factors, not elsewhere classified: Secondary | ICD-10-CM | POA: Diagnosis not present

## 2020-07-01 DIAGNOSIS — I10 Essential (primary) hypertension: Secondary | ICD-10-CM

## 2020-07-01 DIAGNOSIS — E559 Vitamin D deficiency, unspecified: Secondary | ICD-10-CM

## 2020-07-01 DIAGNOSIS — Z6841 Body Mass Index (BMI) 40.0 and over, adult: Secondary | ICD-10-CM | POA: Diagnosis not present

## 2020-07-01 MED ORDER — VITAMIN D (ERGOCALCIFEROL) 1.25 MG (50000 UNIT) PO CAPS: 50000.0000 [IU] | ORAL_CAPSULE | ORAL | 0 refills | Status: DC

## 2020-07-01 MED ORDER — AMLODIPINE BESYLATE 10 MG PO TABS: 10.0000 mg | ORAL_TABLET | Freq: Every day | ORAL | 0 refills | Status: DC

## 2020-07-02 ENCOUNTER — Encounter (INDEPENDENT_AMBULATORY_CARE_PROVIDER_SITE_OTHER): Payer: Self-pay | Admitting: Bariatrics

## 2020-07-02 NOTE — Progress Notes (Signed)
Chief Complaint:   OBESITY Katherine Arellano is here to discuss her progress with her obesity treatment plan along with follow-up of her obesity related diagnoses. Katherine Arellano is on the Category 3 Plan and states she is following her eating plan approximately 0% of the time. Katherine Arellano states she is not exercising regularly at this time.  Today's visit was #: 15 Starting weight: 276 lbs Starting date: 09/04/2019 Today's weight: 275 lbs Today's date: 07/01/2020 Total lbs lost to date: 1 lb Total lbs lost since last in-office visit: 0  Interim History: Katherine Arellano is up 4 pounds.  She has been able to work out today, but previously had knee pain.  She has been out of town.  Subjective:   1. Essential hypertension Controlled.  Review: taking medications as instructed, no medication side effects noted, no chest pain on exertion, no dyspnea on exertion, no swelling of ankles.  She is taking Norvasc 10 mg daily.  BP Readings from Last 3 Encounters:  07/01/20 118/82  05/13/20 (!) 141/65  04/15/20 132/86   2. Vitamin D deficiency Katherine Arellano's Vitamin D level was 44.0 on 02/01/2020. She is currently taking prescription vitamin D 50,000 IU each week. She denies nausea, vomiting or muscle weakness.  3. At risk for activity intolerance Katherine Arellano is at risk for exercise intolerance due to her diagnosis of hypertension.  Assessment/Plan:   1. Essential hypertension Katherine Arellano is working on healthy weight loss and exercise to improve blood pressure control. We will watch for signs of hypotension as she continues her lifestyle modifications.  Continue medication.   -Refill amLODipine (NORVASC) 10 MG tablet; Take 1 tablet (10 mg total) by mouth daily.  Dispense: 30 tablet; Refill: 0  2. Vitamin D deficiency Low Vitamin D level contributes to fatigue and are associated with obesity, breast, and colon cancer. She agrees to continue to take prescription Vitamin D @50 ,000 IU every week and will follow-up for routine testing of Vitamin D,  at least 2-3 times per year to avoid over-replacement.    -Refill Vitamin D, Ergocalciferol, (DRISDOL) 1.25 MG (50000 UNIT) CAPS capsule; Take 1 capsule (50,000 Units total) by mouth every 7 (seven) days.  Dispense: 4 capsule; Refill: 0  3. At risk for activity intolerance Katherine Arellano was given approximately 15 minutes of exercise intolerance counseling today. She is 45 y.o. female and has risk factors exercise intolerance including obesity. We discussed intensive lifestyle modifications today with an emphasis on specific weight loss instructions and strategies. Katherine Arellano will slowly increase activity as tolerated.  Repetitive spaced learning was employed today to elicit superior memory formation and behavioral change.  4. Class 3 severe obesity with serious comorbidity and body mass index (BMI) of 50.0 to 59.9 in adult, unspecified obesity type (HCC)  Katherine Arellano is currently in the action stage of change. As such, her goal is to continue with weight loss efforts. She has agreed to the Category 3 Plan.   She will work on meal planning, intentional eating, and increasing water and protein intake.  Exercise goals: Will resume her workout 3 times per week.  Behavioral modification strategies: increasing lean protein intake, decreasing simple carbohydrates, increasing vegetables, increasing water intake, decreasing eating out, no skipping meals, meal planning and cooking strategies, keeping healthy foods in the home, dealing with family or coworker sabotage, travel eating strategies, holiday eating strategies , celebration eating strategies, avoiding temptations and planning for success.  Katherine Arellano has agreed to follow-up with our clinic in 2-3 weeks. She was informed of the importance of frequent  follow-up visits to maximize her success with intensive lifestyle modifications for her multiple health conditions.   Objective:   Blood pressure 118/82, pulse 85, temperature 98.7 F (37.1 C), height 5\' 2"  (1.575 m),  weight 275 lb (124.7 kg), SpO2 98 %. Body mass index is 50.3 kg/m.  General: Cooperative, alert, well developed, in no acute distress. HEENT: Conjunctivae and lids unremarkable. Cardiovascular: Regular rhythm.  Lungs: Normal work of breathing. Neurologic: No focal deficits.   Lab Results  Component Value Date   CREATININE 0.99 02/01/2020   BUN 12 02/01/2020   NA 138 02/01/2020   K 4.1 02/01/2020   CL 104 02/01/2020   CO2 22 02/01/2020   Lab Results  Component Value Date   ALT 5 02/01/2020   AST 10 02/01/2020   ALKPHOS 44 (L) 02/01/2020   BILITOT 0.7 02/01/2020   Lab Results  Component Value Date   HGBA1C 4.6 (L) 09/04/2019   Lab Results  Component Value Date   INSULIN 7.3 09/04/2019   Lab Results  Component Value Date   TSH 1.080 09/04/2019   Lab Results  Component Value Date   CHOL 169 09/04/2019   HDL 45 09/04/2019   LDLCALC 112 (H) 09/04/2019   TRIG 61 09/04/2019   Lab Results  Component Value Date   WBC 4.9 09/04/2019   HGB 12.8 09/04/2019   HCT 37.5 02/01/2020   MCV 89 09/04/2019   PLT 353 09/04/2019   Lab Results  Component Value Date   IRON 115 02/01/2020   TIBC 289 02/01/2020   FERRITIN 67 02/01/2020   Attestation Statements:   Reviewed by clinician on day of visit: allergies, medications, problem list, medical history, surgical history, family history, social history, and previous encounter notes.  I, Water quality scientist, CMA, am acting as Location manager for [employer], DO  I have reviewed the above documentation for accuracy and completeness, and I agree with the above. Jearld Lesch, DO

## 2020-07-10 ENCOUNTER — Ambulatory Visit: Payer: 59 | Admitting: Nurse Practitioner

## 2020-07-11 ENCOUNTER — Ambulatory Visit (INDEPENDENT_AMBULATORY_CARE_PROVIDER_SITE_OTHER): Payer: 59 | Admitting: Nurse Practitioner

## 2020-07-11 ENCOUNTER — Encounter: Payer: Self-pay | Admitting: Nurse Practitioner

## 2020-07-11 VITALS — BP 126/74 | HR 72 | Temp 98.0°F | Ht 64.6 in | Wt 276.0 lb

## 2020-07-11 DIAGNOSIS — I10 Essential (primary) hypertension: Secondary | ICD-10-CM

## 2020-07-11 DIAGNOSIS — Z7689 Persons encountering health services in other specified circumstances: Secondary | ICD-10-CM

## 2020-07-11 DIAGNOSIS — E7800 Pure hypercholesterolemia, unspecified: Secondary | ICD-10-CM | POA: Diagnosis not present

## 2020-07-11 DIAGNOSIS — Z114 Encounter for screening for human immunodeficiency virus [HIV]: Secondary | ICD-10-CM

## 2020-07-11 DIAGNOSIS — Z1159 Encounter for screening for other viral diseases: Secondary | ICD-10-CM

## 2020-07-11 NOTE — Progress Notes (Signed)
Rutherford Nail as a scribe for [name], FNP.,have documented all relevant documentation on the behalf of [name], FNP,as directed by  [name], FNP while in the presence of [name], [place]. This visit occurred during the SARS-CoV-2 public health emergency.  Safety protocols were in place, including screening questions prior to the visit, additional usage of staff PPE, and extensive cleaning of exam room while observing appropriate contact time as indicated for disinfecting solutions.  Subjective:     Patient ID: Katherine Arellano , female    DOB: December 13, 1975 , 45 y.o.   MRN: [id number]   Chief Complaint  Patient presents with  . Establish Care    HPI  Pt here to establish care today, she does not have a GYN believes she had a pap 3 yrs ago  She had been going to [facility] and she was last see in 2020. She wanted to change providers for a different experience.   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  Single.  No children.    PMH - hypertension. She has been going to the weight loss center at [facility] who has been prescribing her medications. She has been maintaining.    [facility] - Father - prostate cancer, hypertension and type 2 diabetes, cataract surgery.  Mother - hypertension.  Brother - healthy.     She is exercising one hour a week mix cardio and weight training, 2-5 mile walk on weekend and Mix[employer] 2-3 times a week.  She will drink coffee in the morning.  She has mixture of foods. She is trying to limit her bread intake. She is trying to increase her protein.   She has never had to see any specialist in the past.   Hypertension This is a chronic problem. The current episode started more than 1 year ago. The problem is controlled. Pertinent negatives include no headaches.     Past Medical History:  Diagnosis Date  . High blood pressure      Family History  Problem Relation Age of Onset  . High blood pressure Mother   . Diabetes Father    . High blood pressure Father   . Cancer Father        prostate      Current Outpatient Medications:  .  amLODipine (NORVASC) 10 MG tablet, Take 1 tablet (10 mg total) by mouth daily., Disp: 30 tablet, Rfl: 0 .  Biotin 1 MG CAPS, Take by mouth., Disp: , Rfl:  .  ferrous sulfate 325 (65 FE) MG tablet, Take 325 mg by mouth daily with breakfast., Disp: , Rfl:  .  Multiple Vitamin (MULTI-VITAMIN DAILY PO), Take by mouth., Disp: , Rfl:  .  Vitamin D, Ergocalciferol, (DRISDOL) 1.25 MG (50000 UNIT) CAPS capsule, Take 1 capsule (50,000 Units total) by mouth every 7 (seven) days., Disp: 4 capsule, Rfl: 0   No Known Allergies   Review of Systems  Constitutional: Negative.  Negative for fatigue.  HENT: Negative.   Endocrine: Negative for polydipsia, polyphagia and polyuria.  Musculoskeletal: Negative.   Skin: Negative.   Neurological: Negative for dizziness and headaches.  Psychiatric/Behavioral: Negative.      Today's Vitals   07/11/20 1220  BP: 126/74  Pulse: 72  Temp: 98 F (36.7 C)  TempSrc: Oral  Weight: 276 lb (125.2 kg)  Height: 5' 4.6" (1.641 m)   Body mass index is 46.5 kg/m.  Wt Readings from Last 3 Encounters:  07/11/20 276 lb (125.2 kg)  07/01/20  275 lb (124.7 kg)  05/13/20 271 lb (122.9 kg)   Objective:  Physical Exam Vitals reviewed.  Constitutional:      General: She is not in acute distress.    Appearance: Normal appearance. She is well-developed. She is obese.  HENT:     Head: Normocephalic and atraumatic.  Eyes:     Pupils: Pupils are equal, round, and reactive to light.  Cardiovascular:     Rate and Rhythm: Normal rate and regular rhythm.     Pulses: Normal pulses.     Heart sounds: Normal heart sounds. No murmur heard.   Pulmonary:     Effort: Pulmonary effort is normal. No respiratory distress.     Breath sounds: Normal breath sounds. No wheezing.  Musculoskeletal:        General: Normal range of motion.  Skin:    General: Skin is warm and  dry.     Capillary Refill: Capillary refill takes less than 2 seconds.  Neurological:     General: No focal deficit present.     Mental Status: She is alert and oriented to person, place, and time.     Cranial Nerves: No cranial nerve deficit.     Motor: No weakness.  Psychiatric:        Mood and Affect: Mood normal.        Behavior: Behavior normal.        Thought Content: Thought content normal.        Judgment: Judgment normal.         Assessment And Plan:     1. Benign essential hypertension  Chronic, currently well controlled  She is taking amlodipine without any issues  Will check kidney functions - BMP8+eGFR  2. Elevated LDL cholesterol level  Chronic, no current medications  Will check lipid panel - BMP8+eGFR - Lipid panel  3. Morbid obesity (HCC)  Chronic  Discussed healthy diet and regular exercise options   Encouraged to exercise at least 150 minutes per week with 2 days of strength training  Will talk more in detail about her weight at next office visit to see if interested in any other options and possible referral to dietitician  4. Encounter for HIV (human immunodeficiency virus) test - HIV Antibody (routine testing w rflx)  5. Encounter for hepatitis C screening test for low risk patient  Will check Hepatitis C screening due to recent recommendations to screen all adults 18 years and older - Hepatitis C antibody  6. Encounter to establish care She has not seen a provider in approximately a year, this is her first visit here.    Patient was given opportunity to ask questions. Patient verbalized understanding of the plan and was able to repeat key elements of the plan. All questions were answered to their satisfaction.  [name], FNP    I, [name], FNP, have reviewed all documentation for this visit. The documentation on 07/16/20 for the exam, diagnosis, procedures, and orders are all accurate and complete.   THE PATIENT IS  ENCOURAGED TO PRACTICE SOCIAL DISTANCING DUE TO THE COVID-19 PANDEMIC.

## 2020-07-11 NOTE — Patient Instructions (Signed)

## 2020-07-12 LAB — HIV ANTIBODY (ROUTINE TESTING W REFLEX): HIV Screen 4th Generation wRfx: NONREACTIVE

## 2020-07-12 LAB — LIPID PANEL
Chol/HDL Ratio: 4.1 ratio (ref 0.0–4.4)
Cholesterol, Total: 182 mg/dL (ref 100–199)
HDL: 44 mg/dL (ref >39)
LDL Chol Calc (NIH): 127 mg/dL — ABNORMAL HIGH (ref 0–99)
Triglycerides: 60 mg/dL (ref 0–149)
VLDL Cholesterol Cal: 11 mg/dL (ref 5–40)

## 2020-07-12 LAB — BMP8+EGFR
BUN/Creatinine Ratio: 10 (ref 9–23)
BUN: 9 mg/dL (ref 6–24)
CO2: 25 mmol/L (ref 20–29)
Calcium: 9 mg/dL (ref 8.7–10.2)
Chloride: 103 mmol/L (ref 96–106)
Creatinine, Ser: 0.91 mg/dL (ref 0.57–1.00)
GFR calc Af Amer: 88 mL/min/{1.73_m2} (ref >59)
GFR calc non Af Amer: 76 mL/min/{1.73_m2} (ref >59)
Glucose: 75 mg/dL (ref 65–99)
Potassium: 4.1 mmol/L (ref 3.5–5.2)
Sodium: 139 mmol/L (ref 134–144)

## 2020-07-12 LAB — HEPATITIS C ANTIBODY: Hep C Virus Ab: <0.1 s/co ratio (ref 0.0–0.9)

## 2020-07-15 ENCOUNTER — Ambulatory Visit (INDEPENDENT_AMBULATORY_CARE_PROVIDER_SITE_OTHER): Payer: 59 | Admitting: Bariatrics

## 2020-07-16 ENCOUNTER — Encounter: Payer: Self-pay | Admitting: Nurse Practitioner

## 2020-08-12 ENCOUNTER — Other Ambulatory Visit (INDEPENDENT_AMBULATORY_CARE_PROVIDER_SITE_OTHER): Payer: Self-pay | Admitting: Bariatrics

## 2020-08-12 DIAGNOSIS — I10 Essential (primary) hypertension: Secondary | ICD-10-CM

## 2020-08-13 ENCOUNTER — Encounter: Payer: Self-pay | Admitting: Nurse Practitioner

## 2020-08-13 ENCOUNTER — Other Ambulatory Visit: Payer: Self-pay

## 2020-08-13 DIAGNOSIS — I10 Essential (primary) hypertension: Secondary | ICD-10-CM

## 2020-08-13 MED ORDER — AMLODIPINE BESYLATE 10 MG PO TABS: 10.0000 mg | ORAL_TABLET | Freq: Every day | ORAL | 0 refills | Status: DC

## 2020-08-13 NOTE — Telephone Encounter (Signed)
Last OV with Dr Brown 

## 2020-09-10 ENCOUNTER — Other Ambulatory Visit (INDEPENDENT_AMBULATORY_CARE_PROVIDER_SITE_OTHER): Payer: Self-pay | Admitting: Bariatrics

## 2020-09-10 ENCOUNTER — Other Ambulatory Visit: Payer: Self-pay | Admitting: Nurse Practitioner

## 2020-09-10 DIAGNOSIS — E559 Vitamin D deficiency, unspecified: Secondary | ICD-10-CM

## 2020-09-10 DIAGNOSIS — I10 Essential (primary) hypertension: Secondary | ICD-10-CM

## 2020-09-11 MED ORDER — AMLODIPINE BESYLATE 10 MG PO TABS: 10.0000 mg | ORAL_TABLET | Freq: Every day | ORAL | 0 refills | Status: DC

## 2020-09-11 NOTE — Telephone Encounter (Signed)
Last seen by Dr. Brown. 

## 2020-10-07 ENCOUNTER — Other Ambulatory Visit: Payer: Self-pay | Admitting: Nurse Practitioner

## 2020-10-07 DIAGNOSIS — I10 Essential (primary) hypertension: Secondary | ICD-10-CM

## 2020-10-07 MED ORDER — AMLODIPINE BESYLATE 10 MG PO TABS: 10.0000 mg | ORAL_TABLET | Freq: Every day | ORAL | 0 refills | Status: DC

## 2020-11-12 ENCOUNTER — Other Ambulatory Visit (HOSPITAL_COMMUNITY)
Admission: RE | Admit: 2020-11-12 | Discharge: 2020-11-12 | Disposition: A | Discharge status: Home / Self Care | Payer: 59 | Source: Ambulatory Visit | Attending: Nurse Practitioner | Admitting: Nurse Practitioner

## 2020-11-12 ENCOUNTER — Ambulatory Visit (INDEPENDENT_AMBULATORY_CARE_PROVIDER_SITE_OTHER): Payer: 59 | Admitting: Nurse Practitioner

## 2020-11-12 ENCOUNTER — Encounter: Payer: Self-pay | Admitting: Nurse Practitioner

## 2020-11-12 ENCOUNTER — Other Ambulatory Visit: Payer: Self-pay

## 2020-11-12 VITALS — BP 128/82 | HR 88 | Temp 98.3°F | Ht 64.0 in | Wt 282.2 lb

## 2020-11-12 DIAGNOSIS — E7800 Pure hypercholesterolemia, unspecified: Secondary | ICD-10-CM

## 2020-11-12 DIAGNOSIS — I10 Essential (primary) hypertension: Secondary | ICD-10-CM

## 2020-11-12 DIAGNOSIS — Z1211 Encounter for screening for malignant neoplasm of colon: Secondary | ICD-10-CM | POA: Diagnosis not present

## 2020-11-12 DIAGNOSIS — Z124 Encounter for screening for malignant neoplasm of cervix: Secondary | ICD-10-CM | POA: Insufficient documentation

## 2020-11-12 DIAGNOSIS — Z0000 Encounter for general adult medical examination without abnormal findings: Secondary | ICD-10-CM | POA: Diagnosis not present

## 2020-11-12 LAB — POCT URINALYSIS DIPSTICK
Bilirubin, UA: NEGATIVE
Blood, UA: NEGATIVE
Glucose, UA: NEGATIVE
Ketones, UA: NEGATIVE
Leukocytes, UA: NEGATIVE
Nitrite, UA: NEGATIVE
Protein, UA: NEGATIVE
Spec Grav, UA: >=1.03 — AB (ref 1.010–1.025)
Urobilinogen, UA: 0.2 E.U./dL
pH, UA: 6 (ref 5.0–8.0)

## 2020-11-12 LAB — POCT UA - MICROALBUMIN
Creatinine, POC: 300 mg/dL
Microalbumin Ur, POC: 30 mg/L

## 2020-11-12 NOTE — Patient Instructions (Signed)
Health Maintenance, Female Adopting a healthy lifestyle and getting preventive care are important in promoting health and wellness. Ask your health care provider about:  The right schedule for you to have regular tests and exams.  Things you can do on your own to prevent diseases and keep yourself healthy. What should I know about diet, weight, and exercise? Eat a healthy diet  Eat a diet that includes plenty of vegetables, fruits, low-fat dairy products, and lean protein.  Do not eat a lot of foods that are high in solid fats, added sugars, or sodium.   Maintain a healthy weight Body mass index (BMI) is used to identify weight problems. It estimates body fat based on height and weight. Your health care provider can help determine your BMI and help you achieve or maintain a healthy weight. Get regular exercise Get regular exercise. This is one of the most important things you can do for your health. Most adults should:  Exercise for at least 150 minutes each week. The exercise should increase your heart rate and make you sweat (moderate-intensity exercise).  Do strengthening exercises at least twice a week. This is in addition to the moderate-intensity exercise.  Spend less time sitting. Even light physical activity can be beneficial. Watch cholesterol and blood lipids Have your blood tested for lipids and cholesterol at 46 years of age, then have this test every 5 years. Have your cholesterol levels checked more often if:  Your lipid or cholesterol levels are high.  You are older than 46 years of age.  You are at high risk for heart disease. What should I know about cancer screening? Depending on your health history and family history, you may need to have cancer screening at various ages. This may include screening for:  Breast cancer.  Cervical cancer.  Colorectal cancer.  Skin cancer.  Lung cancer. What should I know about heart disease, diabetes, and high blood  pressure? Blood pressure and heart disease  High blood pressure causes heart disease and increases the risk of stroke. This is more likely to develop in people who have high blood pressure readings, are of African descent, or are overweight.  Have your blood pressure checked: ? Every 3-5 years if you are 18-39 years of age. ? Every year if you are 40 years old or older. Diabetes Have regular diabetes screenings. This checks your fasting blood sugar level. Have the screening done:  Once every three years after age 40 if you are at a normal weight and have a low risk for diabetes.  More often and at a younger age if you are overweight or have a high risk for diabetes. What should I know about preventing infection? Hepatitis B If you have a higher risk for hepatitis B, you should be screened for this virus. Talk with your health care provider to find out if you are at risk for hepatitis B infection. Hepatitis C Testing is recommended for:  Everyone born from 1945 through 1965.  Anyone with known risk factors for hepatitis C. Sexually transmitted infections (STIs)  Get screened for STIs, including gonorrhea and chlamydia, if: ? You are sexually active and are younger than 46 years of age. ? You are older than 46 years of age and your health care provider tells you that you are at risk for this type of infection. ? Your sexual activity has changed since you were last screened, and you are at increased risk for chlamydia or gonorrhea. Ask your health care provider   if you are at risk.  Ask your health care provider about whether you are at high risk for HIV. Your health care provider may recommend a prescription medicine to help prevent HIV infection. If you choose to take medicine to prevent HIV, you should first get tested for HIV. You should then be tested every 3 months for as long as you are taking the medicine. Pregnancy  If you are about to stop having your period (premenopausal) and  you may become pregnant, seek counseling before you get pregnant.  Take 400 to 800 micrograms (mcg) of folic acid every day if you become pregnant.  Ask for birth control (contraception) if you want to prevent pregnancy. Osteoporosis and menopause Osteoporosis is a disease in which the bones lose minerals and strength with aging. This can result in bone fractures. If you are 65 years old or older, or if you are at risk for osteoporosis and fractures, ask your health care provider if you should:  Be screened for bone loss.  Take a calcium or vitamin D supplement to lower your risk of fractures.  Be given hormone replacement therapy (HRT) to treat symptoms of menopause. Follow these instructions at home: Lifestyle  Do not use any products that contain nicotine or tobacco, such as cigarettes, e-cigarettes, and chewing tobacco. If you need help quitting, ask your health care provider.  Do not use street drugs.  Do not share needles.  Ask your health care provider for help if you need support or information about quitting drugs. Alcohol use  Do not drink alcohol if: ? Your health care provider tells you not to drink. ? You are pregnant, may be pregnant, or are planning to become pregnant.  If you drink alcohol: ? Limit how much you use to 0-1 drink a day. ? Limit intake if you are breastfeeding.  Be aware of how much alcohol is in your drink. In the [place] one drink equals one 12 oz bottle of beer (355 mL), one 5 oz glass of wine (148 mL), or one 1 oz glass of hard liquor (44 mL). General instructions  Schedule regular health, dental, and eye exams.  Stay current with your vaccines.  Tell your health care provider if: ? You often feel depressed. ? You have ever been abused or do not feel safe at home. Summary  Adopting a healthy lifestyle and getting preventive care are important in promoting health and wellness.  Follow your health care provider's instructions about healthy  diet, exercising, and getting tested or screened for diseases.  Follow your health care provider's instructions on monitoring your cholesterol and blood pressure. This information is not intended to replace advice given to you by your health care provider. Make sure you discuss any questions you have with your health care provider. Document Revised: 06/08/2018 Document Reviewed: 06/08/2018 Elsevier Patient Education  2021 Elsevier Inc.  

## 2020-11-12 NOTE — Progress Notes (Signed)
I,Deshone Lyssy,acting as a Education administrator for [name], FNP.,have documented all relevant documentation on the behalf of [name], FNP,as directed by  [name], FNP while in the presence of [name], [name]. This visit occurred during the SARS-CoV-2 public health emergency.  Safety protocols were in place, including screening questions prior to the visit, additional usage of staff PPE, and extensive cleaning of exam room while observing appropriate contact time as indicated for disinfecting solutions.  Subjective:     Patient ID: Katherine Arellano , female    DOB: 21-Mar-1976 , 45 y.o.   MRN: [id number]   Chief Complaint  Patient presents with  . Annual Exam    HPI  Pt is here for HM. Pt would like an referral to get her colonoscopy done.     Past Medical History:  Diagnosis Date  . High blood pressure      Family History  Problem Relation Age of Onset  . High blood pressure Mother   . Diabetes Father   . High blood pressure Father   . Cancer Father        prostate      Current Outpatient Medications:  .  amLODipine (NORVASC) 10 MG tablet, Take 1 tablet (10 mg total) by mouth daily., Disp: 90 tablet, Rfl: 0 .  Biotin 1 MG CAPS, Take by mouth., Disp: , Rfl:  .  ferrous sulfate 325 (65 FE) MG tablet, Take 325 mg by mouth daily with breakfast., Disp: , Rfl:  .  Multiple Vitamin (MULTI-VITAMIN DAILY PO), Take by mouth., Disp: , Rfl:    No Known Allergies    The patient states she uses none for birth control.  Patient's last menstrual period was 10/28/2020 (exact date).   Negative for Dysmenorrhea and Negative for Menorrhagia. Negative for: breast discharge, breast lump(s), breast pain and breast self exam. Associated symptoms include abnormal vaginal bleeding. Pertinent negatives include abnormal bleeding (hematology), anxiety, decreased libido, depression, difficulty falling sleep, dyspareunia, history of infertility, nocturia, sexual dysfunction, sleep disturbances, urinary  incontinence, urinary urgency, vaginal discharge and vaginal itching. Diet regular. The patient states her exercise level is moderate - 5-6 days a week cardio and strength 3 times and 2 cardio and 1 strength only.   The patient's tobacco use is:  Social History   Tobacco Use  Smoking Status Never Smoker  Smokeless Tobacco Never Used   She has been exposed to passive smoke. The patient's alcohol use is:  Social History   Substance and Sexual Activity  Alcohol Use Yes   Additional information: Last pap unknown, next one scheduled for today  Review of Systems  Constitutional: Negative.   HENT: Negative.   Eyes: Negative.   Respiratory: Negative.   Cardiovascular: Negative.   Gastrointestinal: Negative.   Endocrine: Negative.   Genitourinary: Negative.   Musculoskeletal: Negative.   Skin: Negative.   Allergic/Immunologic: Negative.   Neurological: Negative.   Hematological: Negative.   Psychiatric/Behavioral: Negative.      Today's Vitals   11/12/20 1505  BP: 128/82  Pulse: 88  Temp: 98.3 F (36.8 C)  SpO2: 99%  Weight: 282 lb 3.2 oz (128 kg)  Height: 5\' 4"  (1.626 m)  PainSc: 0-No pain   Body mass index is 48.44 kg/m.  Wt Readings from Last 3 Encounters:  11/12/20 282 lb 3.2 oz (128 kg)  07/11/20 276 lb (125.2 kg)  07/01/20 275 lb (124.7 kg)   Objective:  Physical Exam Constitutional:      General: She is not in  acute distress.    Appearance: Normal appearance. She is well-developed. She is obese.  HENT:     Head: Normocephalic and atraumatic.     Right Ear: Hearing, tympanic membrane, ear canal and external ear normal. There is no impacted cerumen.     Left Ear: Hearing, tympanic membrane, ear canal and external ear normal. There is no impacted cerumen.     Nose:     Comments: Deferred - masked    Mouth/Throat:     Comments: Deferred - masked Eyes:     General: Lids are normal.     Extraocular Movements: Extraocular movements intact.      Conjunctiva/sclera: Conjunctivae normal.     Pupils: Pupils are equal, round, and reactive to light.     Funduscopic exam:    Right eye: No papilledema.        Left eye: No papilledema.  Neck:     Thyroid: No thyroid mass.     Vascular: No carotid bruit.  Cardiovascular:     Rate and Rhythm: Normal rate and regular rhythm.     Pulses: Normal pulses.     Heart sounds: Normal heart sounds. No murmur heard.   Pulmonary:     Effort: Pulmonary effort is normal. No respiratory distress.     Breath sounds: Normal breath sounds. No wheezing.  Chest:     Chest wall: No mass.  Breasts:     Tanner Score is 5.     Right: Normal. No mass, tenderness, axillary adenopathy or supraclavicular adenopathy.     Left: Normal. No mass, tenderness, axillary adenopathy or supraclavicular adenopathy.    Abdominal:     General: Abdomen is flat. Bowel sounds are normal. There is no distension.     Palpations: Abdomen is soft.     Tenderness: There is no abdominal tenderness.  Genitourinary:    General: Normal vulva.     Vagina: No vaginal discharge.     Cervix: Normal.     Uterus: Normal.      Adnexa: Right adnexa normal and left adnexa normal.       Right: No mass.         Left: No mass.       Rectum: Normal. Guaiac result negative (negative).  Musculoskeletal:        General: No swelling. Normal range of motion.     Cervical back: Full passive range of motion without pain, normal range of motion and neck supple.     Right lower leg: No edema.     Left lower leg: No edema.  Lymphadenopathy:     Upper Body:     Right upper body: No supraclavicular, axillary or pectoral adenopathy.     Left upper body: No supraclavicular, axillary or pectoral adenopathy.  Skin:    General: Skin is warm and dry.     Capillary Refill: Capillary refill takes less than 2 seconds.     Coloration: Skin is not jaundiced.     Findings: No bruising.  Neurological:     General: No focal deficit present.     Mental  Status: She is alert and oriented to person, place, and time.     Cranial Nerves: No cranial nerve deficit.     Sensory: No sensory deficit.     Motor: No weakness.  Psychiatric:        Mood and Affect: Mood normal.        Behavior: Behavior normal.  Thought Content: Thought content normal.        Judgment: Judgment normal.         Assessment And Plan:     1. Annual physical exam . Behavior modifications discussed and diet history reviewed.   . Pt will continue to exercise regularly and modify diet with low GI, plant based foods and decrease intake of processed foods.  . Recommend intake of daily multivitamin, Vitamin D, and calcium.  . Recommend mammogram (up to date) and colonoscopy for preventive screenings, as well as recommend immunizations that include influenza, TDAP  2. Encounter for screening colonoscopy  According to USPTF Colorectal cancer Screening guidelines. Colonoscopy is recommended every 10 years, starting at age 24 years.  Will refer to GI for colon cancer screening. - Ambulatory referral to Gastroenterology  3. Encounter for Papanicolaou smear of cervix  PAP done no abnormal findings - Cytology -Pap Smear  4. Essential hypertension  Chronic blood pressure is well controlled  Continue with current medications  EKG done with NSR HR 75 - POCT UA - Microalbumin - POCT urinalysis dipstick - EKG 12-Lead  5. Elevated cholesterol  Chronic, stable  Encouraged to limit intake of fatty foods - Lipid panel     Patient was given opportunity to ask questions. Patient verbalized understanding of the plan and was able to repeat key elements of the plan. All questions were answered to their satisfaction.   [name], FNP    I, [name], FNP, have reviewed all documentation for this visit. The documentation on 11/12/20 for the exam, diagnosis, procedures, and orders are all accurate and complete.  THE PATIENT IS ENCOURAGED TO PRACTICE SOCIAL  DISTANCING DUE TO THE COVID-19 PANDEMIC.

## 2020-11-13 LAB — LIPID PANEL
Chol/HDL Ratio: 4.1 ratio (ref 0.0–4.4)
Cholesterol, Total: 189 mg/dL (ref 100–199)
HDL: 46 mg/dL (ref >39)
LDL Chol Calc (NIH): 129 mg/dL — ABNORMAL HIGH (ref 0–99)
Triglycerides: 74 mg/dL (ref 0–149)
VLDL Cholesterol Cal: 14 mg/dL (ref 5–40)

## 2020-11-15 LAB — CYTOLOGY - PAP: Diagnosis: NEGATIVE

## 2020-11-20 ENCOUNTER — Encounter: Payer: Self-pay | Admitting: Nurse Practitioner

## 2020-11-28 ENCOUNTER — Encounter: Payer: Self-pay | Admitting: Gastroenterology

## 2020-12-13 ENCOUNTER — Other Ambulatory Visit: Payer: Self-pay | Admitting: Nurse Practitioner

## 2020-12-13 DIAGNOSIS — I10 Essential (primary) hypertension: Secondary | ICD-10-CM

## 2021-01-17 ENCOUNTER — Other Ambulatory Visit: Payer: Self-pay

## 2021-01-17 ENCOUNTER — Ambulatory Visit (AMBULATORY_SURGERY_CENTER): Payer: 59 | Admitting: *Deleted

## 2021-01-17 VITALS — Ht 66.0 in | Wt 285.0 lb

## 2021-01-17 DIAGNOSIS — Z1211 Encounter for screening for malignant neoplasm of colon: Secondary | ICD-10-CM

## 2021-01-17 MED ORDER — NA SULFATE-K SULFATE-MG SULF 17.5-3.13-1.6 GM/177ML PO SOLN: ORAL | 0 refills | Status: DC

## 2021-01-17 NOTE — Progress Notes (Signed)
Patient's pre-visit was done today over the phone with the patient due to COVID-19 pandemic. Name,DOB and address verified. Insurance verified. Patient denies any allergies to Eggs and Soy. Patient denies any problems with anesthesia/sedation. Patient denies taking diet pills or blood thinners. No home Oxygen. Packet of Prep instructions mailed to patient including a copy of a consent form-pt is aware. Patient understands to call us back with any questions or concerns. Patient is aware of our care-partner policy and Covid-19 safety protocol.   EMMI education assigned to the patient for the procedure, sent to MyChart.   The patient is COVID-19 vaccinated, per patient.  

## 2021-01-31 ENCOUNTER — Ambulatory Visit (AMBULATORY_SURGERY_CENTER): Payer: 59 | Admitting: Gastroenterology

## 2021-01-31 ENCOUNTER — Other Ambulatory Visit: Payer: Self-pay

## 2021-01-31 ENCOUNTER — Encounter: Payer: Self-pay | Admitting: Gastroenterology

## 2021-01-31 VITALS — BP 149/90 | HR 62 | Temp 98.6°F | Resp 18 | Ht 66.0 in | Wt 285.0 lb

## 2021-01-31 DIAGNOSIS — Z1211 Encounter for screening for malignant neoplasm of colon: Secondary | ICD-10-CM

## 2021-01-31 MED ORDER — SODIUM CHLORIDE 0.9 % IV SOLN
500.0000 mL | Freq: Once | INTRAVENOUS | Status: DC
Start: 2021-01-31 — End: 2021-01-31

## 2021-01-31 NOTE — Progress Notes (Signed)
NAD VSS Transferred to recovery RN

## 2021-01-31 NOTE — Progress Notes (Signed)
Pt's states no medical or surgical changes since previsit or office visit.   VS taken by DT 

## 2021-01-31 NOTE — Patient Instructions (Signed)
Handout on diverticulosis and high fiber given.  Use Fiber- Con 1-2 tablets by mouth daily.   YOU HAD AN ENDOSCOPIC PROCEDURE TODAY AT [facility]:   Refer to the procedure report that was given to you for any specific questions about what was found during the examination.  If the procedure report does not answer your questions, please call your gastroenterologist to clarify.  If you requested that your care partner not be given the details of your procedure findings, then the procedure report has been included in a sealed envelope for you to review at your convenience later.  YOU SHOULD EXPECT: Some feelings of bloating in the abdomen. Passage of more gas than usual.  Walking can help get rid of the air that was put into your GI tract during the procedure and reduce the bloating. If you had a lower endoscopy (such as a colonoscopy or flexible sigmoidoscopy) you may notice spotting of blood in your stool or on the toilet paper. If you underwent a bowel prep for your procedure, you may not have a normal bowel movement for a few days.  Please Note:  You might notice some irritation and congestion in your nose or some drainage.  This is from the oxygen used during your procedure.  There is no need for concern and it should clear up in a day or so.  SYMPTOMS TO REPORT IMMEDIATELY:  Following lower endoscopy (colonoscopy or flexible sigmoidoscopy):  Excessive amounts of blood in the stool  Significant tenderness or worsening of abdominal pains  Swelling of the abdomen that is new, acute  Fever of 100F or higher   For urgent or emergent issues, a gastroenterologist can be reached at any hour by calling [phone]. Do not use MyChart messaging for urgent concerns.    DIET:  We do recommend a small meal at first, but then you may proceed to your regular diet.  Drink plenty of fluids but you should avoid alcoholic beverages for 24 hours.  ACTIVITY:  You should plan to take it  easy for the rest of today and you should NOT DRIVE or use heavy machinery until tomorrow (because of the sedation medicines used during the test).    FOLLOW UP: Our staff will call the number listed on your records 48-72 hours following your procedure to check on you and address any questions or concerns that you may have regarding the information given to you following your procedure. If we do not reach you, we will leave a message.  We will attempt to reach you two times.  During this call, we will ask if you have developed any symptoms of COVID 19. If you develop any symptoms (ie: fever, flu-like symptoms, shortness of breath, cough etc.) before then, please call [phone].  If you test positive for Covid 19 in the 2 weeks post procedure, please call and report this information to [place].    If any biopsies were taken you will be contacted by phone or by letter within the next 1-3 weeks.  Please call us at [phone] if you have not heard about the biopsies in 3 weeks.    SIGNATURES/CONFIDENTIALITY: You and/or your care partner have signed paperwork which will be entered into your electronic medical record.  These signatures attest to the fact that that the information above on your After Visit Summary has been reviewed and is understood.  Full responsibility of the confidentiality of this discharge information lies with you and/or your care-partner.

## 2021-01-31 NOTE — Op Note (Signed)
[facility] Patient Name: Katherine Arellano Procedure Date: 01/31/2021 9:44 AM MRN: [id number] Endoscopist: Justice Britain , MD Age: 45 Referring MD:  Date of Birth: 24-Apr-1976 Gender: Female Account #: [id number] Procedure:                Colonoscopy Indications:              Screening for colorectal malignant neoplasm, This                            is the patient's first colonoscopy Medicines:                Monitored Anesthesia Care Procedure:                Pre-Anesthesia Assessment:                           - Prior to the procedure, a History and Physical                            was performed, and patient medications and                            allergies were reviewed. The patient's tolerance of                            previous anesthesia was also reviewed. The risks                            and benefits of the procedure and the sedation                            options and risks were discussed with the patient.                            All questions were answered, and informed consent                            was obtained. Prior Anticoagulants: The patient has                            taken no previous anticoagulant or antiplatelet                            agents. ASA Grade Assessment: II - A patient with                            mild systemic disease. After reviewing the risks                            and benefits, the patient was deemed in                            satisfactory condition to undergo the procedure.  After obtaining informed consent, the colonoscope                            was passed under direct vision. Throughout the                            procedure, the patient's blood pressure, pulse, and                            oxygen saturations were monitored continuously. The                            CF [id number] #[id number] was introduced through the anus                            and advanced to the the  cecum, identified by                            palpation. The colonoscopy was performed without                            difficulty. The patient tolerated the procedure.                            The quality of the bowel preparation was good. The                            ileocecal valve, appendiceal orifice, and rectum                            were photographed. Scope In: 10:14:25 AM Scope Out: 10:26:54 AM Scope Withdrawal Time: 0 hours 8 minutes 11 seconds  Total Procedure Duration: 0 hours 12 minutes 29 seconds  Findings:                 The digital rectal exam findings include                            hemorrhoids. Pertinent negatives include no                            palpable rectal lesions.                           Multiple small-mouthed diverticula were found in                            the recto-sigmoid colon.                           Normal mucosa was found in the entire colon                            otherwise.  Non-bleeding non-thrombosed external and internal                            hemorrhoids were found during retroflexion, during                            perianal exam and during digital exam. The                            hemorrhoids were Grade II (internal hemorrhoids                            that prolapse but reduce spontaneously). Complications:            No immediate complications. Estimated Blood Loss:     Estimated blood loss: none. Impression:               - Hemorrhoids found on digital rectal exam.                           - Diverticulosis in the recto-sigmoid colon.                           - Normal mucosa in the entire examined colon                            otherwise.                           - Non-bleeding non-thrombosed external and internal                            hemorrhoids. Recommendation:           - The patient will be observed post-procedure,                            until all discharge  criteria are met.                           - Discharge patient to home.                           - Patient has a contact number available for                            emergencies. The signs and symptoms of potential                            delayed complications were discussed with the                            patient. Return to normal activities tomorrow.                            Written discharge instructions were provided to the  patient.                           - High fiber diet.                           - Use FiberCon 1-2 tablets PO daily.                           - Continue present medications.                           - Repeat colonoscopy in 10 years for screening                            purposes.                           - The findings and recommendations were discussed                            with the patient.                           - The findings and recommendations were discussed                            with the patient's family. Justice Britain, MD 01/31/2021 10:31:16 AM

## 2021-02-04 ENCOUNTER — Telehealth: Payer: Self-pay

## 2021-02-04 NOTE — Telephone Encounter (Signed)
  Follow up Call-  Call back number 01/31/2021  Post procedure Call Back phone  # [phone]  Permission to leave phone message Yes     Patient questions:  Do you have a fever, pain , or abdominal swelling? No. Pain Score  0 *  Have you tolerated food without any problems? Yes.    Have you been able to return to your normal activities? Yes.    Do you have any questions about your discharge instructions: Diet   No. Medications  No. Follow up visit  No.  Do you have questions or concerns about your Care? No.  Actions: * If pain score is 4 or above: No action needed, pain <4.  Have you developed a fever since your procedure? No   2.   Have you had an respiratory symptoms (SOB or cough) since your procedure? No   3.   Have you tested positive for COVID 19 since your procedure no   4.   Have you had any family members/close contacts diagnosed with the COVID 19 since your procedure?  No    If yes to any of these questions please route to Joylene John, RN and Joella Prince, RN

## 2021-05-15 ENCOUNTER — Other Ambulatory Visit: Payer: Self-pay

## 2021-05-15 ENCOUNTER — Encounter: Payer: Self-pay | Admitting: Nurse Practitioner

## 2021-05-15 ENCOUNTER — Ambulatory Visit (INDEPENDENT_AMBULATORY_CARE_PROVIDER_SITE_OTHER): Payer: 59 | Admitting: Nurse Practitioner

## 2021-05-15 VITALS — BP 128/72 | HR 81 | Temp 98.7°F | Ht 65.6 in | Wt 288.4 lb

## 2021-05-15 DIAGNOSIS — E7800 Pure hypercholesterolemia, unspecified: Secondary | ICD-10-CM | POA: Diagnosis not present

## 2021-05-15 DIAGNOSIS — R197 Diarrhea, unspecified: Secondary | ICD-10-CM | POA: Diagnosis not present

## 2021-05-15 DIAGNOSIS — I10 Essential (primary) hypertension: Secondary | ICD-10-CM

## 2021-05-15 DIAGNOSIS — Z9889 Other specified postprocedural states: Secondary | ICD-10-CM

## 2021-05-15 DIAGNOSIS — Z6841 Body Mass Index (BMI) 40.0 and over, adult: Secondary | ICD-10-CM

## 2021-05-15 MED ORDER — CHOLESTYRAMINE 4 G PO PACK: 4.0000 g | PACK | Freq: Three times a day (TID) | ORAL | 1 refills | Status: DC

## 2021-05-15 NOTE — Progress Notes (Signed)
I,Tianna Badgett,acting as a Education administrator for [employer], FNP.,have documented all relevant documentation on the behalf of [name], FNP,as directed by  [name], FNP while in the presence of [name], [place].  This visit occurred during the SARS-CoV-2 public health emergency.  Safety protocols were in place, including screening questions prior to the visit, additional usage of staff PPE, and extensive cleaning of exam room while observing appropriate contact time as indicated for disinfecting solutions.  Subjective:     Patient ID: Katherine Arellano , female    DOB: [birth year] , 45 y.o.   MRN: [id number]   Chief Complaint  Patient presents with   Hypertension    HPI  Patient is here for htn follow up.   Wt Readings from Last 3 Encounters: 05/15/21 : 288 lb 6.4 oz (130.8 kg) 01/31/21 : 285 lb (129.3 kg) 01/17/21 : 285 lb (129.3 kg)  This past month she has been more active with walking. She was doing refit once a week and other exercises 3 times a week. Her diet has improved. The past few months has been trying to eat better with oatmeal in the morning. She is doing Hello Fresh healthy options. She is drinking approximately 60 - 64 oz water a day  Hypertension This is a chronic problem. The current episode started more than 1 year ago. The problem has been gradually improving since onset. The problem is controlled. Pertinent negatives include no anxiety.    Past Medical History:  Diagnosis Date   High blood pressure      Family History  Problem Relation Age of Onset   High blood pressure Mother    Diabetes Father    High blood pressure Father    Cancer Father        prostate    Colon cancer Neg Hx    Esophageal cancer Neg Hx    Rectal cancer Neg Hx    Stomach cancer Neg Hx    Colon polyps Neg Hx      Current Outpatient Medications:    amLODipine (NORVASC) 10 MG tablet, TAKE 1 TABLET BY MOUTH  DAILY, Disp: 90 tablet, Rfl: 3   cholestyramine (QUESTRAN) 4 g packet, Take 1  packet (4 g total) by mouth 3 (three) times daily with meals., Disp: 90 each, Rfl: 1   ferrous sulfate 325 (65 FE) MG tablet, Take 325 mg by mouth daily with breakfast., Disp: , Rfl:    Multiple Vitamin (MULTI-VITAMIN DAILY PO), Take by mouth., Disp: , Rfl:    No Known Allergies   Review of Systems  Constitutional: Negative.   Respiratory: Negative.    Cardiovascular: Negative.   Gastrointestinal: Negative.   Neurological: Negative.   Psychiatric/Behavioral: Negative.      Today's Vitals   05/15/21 1545  BP: 128/72  Pulse: 81  Temp: 98.7 F (37.1 C)  TempSrc: Oral  Weight: 288 lb 6.4 oz (130.8 kg)  Height: 5' 5.6" (1.666 m)   Body mass index is 47.12 kg/m.  Wt Readings from Last 3 Encounters:  05/15/21 288 lb 6.4 oz (130.8 kg)  01/31/21 285 lb (129.3 kg)  01/17/21 285 lb (129.3 kg)    Objective:  Physical Exam Vitals reviewed.  Constitutional:      General: She is not in acute distress.    Appearance: Normal appearance. She is well-developed. She is obese.  HENT:     Head: Normocephalic.  Eyes:     Pupils: Pupils are equal, round, and reactive to light.  Cardiovascular:  Rate and Rhythm: Normal rate and regular rhythm.     Pulses: Normal pulses.     Heart sounds: Normal heart sounds. No murmur heard. Pulmonary:     Effort: Pulmonary effort is normal. No respiratory distress.     Breath sounds: Normal breath sounds. No wheezing.  Musculoskeletal:        General: Normal range of motion.  Skin:    General: Skin is warm and dry.     Capillary Refill: Capillary refill takes less than 2 seconds.  Neurological:     General: No focal deficit present.     Mental Status: She is alert and oriented to person, place, and time.     Cranial Nerves: No cranial nerve deficit.  Psychiatric:        Mood and Affect: Mood normal.        Behavior: Behavior normal.        Thought Content: Thought content normal.        Judgment: Judgment normal.        Assessment And  Plan:     1. Essential hypertension Comments: Blood pressure is well controlled, continue current medications - BMP8+eGFR  2. Elevated cholesterol Comments: Stable, diet controlled. Avoid high fat foods. - BMP8+eGFR - Lipid panel  3. Morbid obesity (HCC) Chronic Discussed healthy diet and regular exercise options  Encouraged to exercise at least 150 minutes per week with 2 days of strength training  4. Diarrhea following gastrointestinal surgery Comments: cholecystectomy, has been having diarrhea since will try her on Questran     Patient was given opportunity to ask questions. Patient verbalized understanding of the plan and was able to repeat key elements of the plan. All questions were answered to their satisfaction.  [name], FNP   I, [name], FNP, have reviewed all documentation for this visit. The documentation on 06/08/21 for the exam, diagnosis, procedures, and orders are all accurate and complete.   IF YOU HAVE BEEN REFERRED TO A SPECIALIST, IT MAY TAKE 1-2 WEEKS TO SCHEDULE/PROCESS THE REFERRAL. IF YOU HAVE NOT HEARD FROM US/SPECIALIST IN TWO WEEKS, PLEASE GIVE [place] A CALL AT [phone].   THE PATIENT IS ENCOURAGED TO PRACTICE SOCIAL DISTANCING DUE TO THE COVID-19 PANDEMIC.

## 2021-05-15 NOTE — Patient Instructions (Signed)

## 2021-05-16 LAB — BMP8+EGFR
BUN/Creatinine Ratio: 11 (ref 9–23)
BUN: 9 mg/dL (ref 6–24)
CO2: 24 mmol/L (ref 20–29)
Calcium: 9.1 mg/dL (ref 8.7–10.2)
Chloride: 102 mmol/L (ref 96–106)
Creatinine, Ser: 0.84 mg/dL (ref 0.57–1.00)
Glucose: 77 mg/dL (ref 70–99)
Potassium: 3.9 mmol/L (ref 3.5–5.2)
Sodium: 139 mmol/L (ref 134–144)
eGFR: 87 mL/min/{1.73_m2} (ref >59)

## 2021-05-16 LAB — LIPID PANEL
Chol/HDL Ratio: 3.7 ratio (ref 0.0–4.4)
Cholesterol, Total: 172 mg/dL (ref 100–199)
HDL: 46 mg/dL (ref >39)
LDL Chol Calc (NIH): 114 mg/dL — ABNORMAL HIGH (ref 0–99)
Triglycerides: 61 mg/dL (ref 0–149)
VLDL Cholesterol Cal: 12 mg/dL (ref 5–40)

## 2021-05-30 LAB — HM MAMMOGRAPHY

## 2021-06-02 ENCOUNTER — Encounter: Payer: Self-pay | Admitting: Nurse Practitioner

## 2021-06-26 ENCOUNTER — Other Ambulatory Visit: Payer: Self-pay

## 2021-06-26 ENCOUNTER — Ambulatory Visit (INDEPENDENT_AMBULATORY_CARE_PROVIDER_SITE_OTHER): Payer: 59 | Admitting: Nurse Practitioner

## 2021-06-26 ENCOUNTER — Encounter: Payer: Self-pay | Admitting: Nurse Practitioner

## 2021-06-26 DIAGNOSIS — Z6841 Body Mass Index (BMI) 40.0 and over, adult: Secondary | ICD-10-CM

## 2021-06-26 NOTE — Patient Instructions (Signed)
Wellbutrin helps with mood and cravings - has been used for smoking cessation  Vyvanse has been used for ADD but has also been shown to help with binge eating.

## 2021-06-26 NOTE — Progress Notes (Signed)
I,Yamilka J Llittleton,acting as a Education administrator for [employer], FNP.,have documented all relevant documentation on the behalf of [name], FNP,as directed by  [name], FNP while in the presence of [name], [place].  This visit occurred during the SARS-CoV-2 public health emergency.  Safety protocols were in place, including screening questions prior to the visit, additional usage of staff PPE, and extensive cleaning of exam room while observing appropriate contact time as indicated for disinfecting solutions.  Subjective:     Patient ID: Katherine Arellano , female    DOB: May 17, 1976 , 45 y.o.   MRN: [id number]   No chief complaint on file.   HPI  She is here today was to be a medication follow up but did not take due to not having diarrhea initially.  She is now back to being able to move more after having pain to both knees. She is getting back to where she was previously, November was the first month to do better. She works out with a group MWF - Editor, commissioning, calistetics and aerobics (once a week), she is walking 3-4 miles on Saturday. Next year she is planning to do a zumba like exercise 2 times a week. Was at 11,000 - 12,000 steps a day down to 2,000. She has been changing her diet as well. She is trying to make sure she is drinking enough water and keeping track of her water. She is trying to meatless day. She notices she does mindless eating and portion control. She is trying to not eat high calorie snacks. Trying not to eat when she is "bored". She denies binge eating now, has done in the past. She has been under more stress in the last month, when she is depressed she eats more. She works as a Designer, industrial/product.   Wt Readings from Last 3 Encounters: 06/26/21 : 282 lb 6.4 oz (128.1 kg) 05/15/21 : 288 lb 6.4 oz (130.8 kg) 01/31/21 : 285 lb (129.3 kg)      Past Medical History:  Diagnosis Date   High blood pressure      Family History  Problem Relation Age of Onset    High blood pressure Mother    Diabetes Father    High blood pressure Father    Cancer Father        prostate    Colon cancer Neg Hx    Esophageal cancer Neg Hx    Rectal cancer Neg Hx    Stomach cancer Neg Hx    Colon polyps Neg Hx      Current Outpatient Medications:    amLODipine (NORVASC) 10 MG tablet, TAKE 1 TABLET BY MOUTH  DAILY, Disp: 90 tablet, Rfl: 3   CALCIUM PO, Take 600 mg by mouth daily at 2 am., Disp: , Rfl:    ferrous sulfate 325 (65 FE) MG tablet, Take 325 mg by mouth daily with breakfast., Disp: , Rfl:    Multiple Vitamin (MULTI-VITAMIN DAILY PO), Take by mouth., Disp: , Rfl:    VITAMIN D PO, Take 5,000 mg by mouth daily at 2 am., Disp: , Rfl:    No Known Allergies   Review of Systems  Constitutional: Negative.   Respiratory: Negative.    Cardiovascular: Negative.   Neurological:  Negative for dizziness and headaches.  Psychiatric/Behavioral: Negative.      Today's Vitals   06/26/21 1429  BP: 130/82  Pulse: 88  Temp: 99.1 F (37.3 C)  Weight: 282 lb 6.4 oz (128.1 kg)  Height:  5' 5.6" (1.666 m)  PainSc: 0-No pain   Body mass index is 46.14 kg/m.   Objective:  Physical Exam Vitals reviewed.  Constitutional:      General: She is not in acute distress.    Appearance: Normal appearance.  Pulmonary:     Effort: Pulmonary effort is normal. No respiratory distress.  Neurological:     General: No focal deficit present.     Mental Status: She is alert and oriented to person, place, and time.     Cranial Nerves: No cranial nerve deficit.     Motor: No weakness.  Psychiatric:        Mood and Affect: Mood normal.        Behavior: Behavior normal.        Thought Content: Thought content normal.        Judgment: Judgment normal.        Assessment And Plan:     1. Morbid obesity ([place]) Comments: Will re-visit wellbutrin vs vyvanse vs another weight loss medication at physical in May 2023.  2. BMI 45.0-49.9, adult ([place])   Advised can discard the  cholestyramine she is not having any diarrhea  Patient was given opportunity to ask questions. Patient verbalized understanding of the plan and was able to repeat key elements of the plan. All questions were answered to their satisfaction.  [name], FNP   I, [name], FNP, have reviewed all documentation for this visit. The documentation on 06/26/21 for the exam, diagnosis, procedures, and orders are all accurate and complete.   IF YOU HAVE BEEN REFERRED TO A SPECIALIST, IT MAY TAKE 1-2 WEEKS TO SCHEDULE/PROCESS THE REFERRAL. IF YOU HAVE NOT HEARD FROM US/SPECIALIST IN TWO WEEKS, PLEASE GIVE [place] A CALL AT [phone].   THE PATIENT IS ENCOURAGED TO PRACTICE SOCIAL DISTANCING DUE TO THE COVID-19 PANDEMIC.

## 2021-10-25 ENCOUNTER — Other Ambulatory Visit: Payer: Self-pay | Admitting: Nurse Practitioner

## 2021-10-25 DIAGNOSIS — I10 Essential (primary) hypertension: Secondary | ICD-10-CM

## 2021-11-17 ENCOUNTER — Encounter: Payer: Self-pay | Admitting: Nurse Practitioner

## 2021-11-17 ENCOUNTER — Ambulatory Visit (INDEPENDENT_AMBULATORY_CARE_PROVIDER_SITE_OTHER): Payer: 59 | Admitting: Nurse Practitioner

## 2021-11-17 VITALS — BP 130/70 | HR 87 | Temp 98.2°F | Ht 65.6 in | Wt 282.0 lb

## 2021-11-17 DIAGNOSIS — E7800 Pure hypercholesterolemia, unspecified: Secondary | ICD-10-CM | POA: Diagnosis not present

## 2021-11-17 DIAGNOSIS — M79604 Pain in right leg: Secondary | ICD-10-CM | POA: Diagnosis not present

## 2021-11-17 DIAGNOSIS — R1900 Intra-abdominal and pelvic swelling, mass and lump, unspecified site: Secondary | ICD-10-CM | POA: Diagnosis not present

## 2021-11-17 DIAGNOSIS — Z0000 Encounter for general adult medical examination without abnormal findings: Secondary | ICD-10-CM | POA: Diagnosis not present

## 2021-11-17 DIAGNOSIS — Z79899 Other long term (current) drug therapy: Secondary | ICD-10-CM

## 2021-11-17 DIAGNOSIS — M79605 Pain in left leg: Secondary | ICD-10-CM

## 2021-11-17 DIAGNOSIS — I10 Essential (primary) hypertension: Secondary | ICD-10-CM | POA: Diagnosis not present

## 2021-11-17 LAB — POCT URINALYSIS DIPSTICK
Bilirubin, UA: NEGATIVE
Blood, UA: NEGATIVE
Glucose, UA: NEGATIVE
Ketones, UA: NEGATIVE
Leukocytes, UA: NEGATIVE
Nitrite, UA: NEGATIVE
Protein, UA: NEGATIVE
Spec Grav, UA: 1.015 (ref 1.010–1.025)
Urobilinogen, UA: 0.2 E.U./dL
pH, UA: 7.5 (ref 5.0–8.0)

## 2021-11-17 MED ORDER — MAGNESIUM 250 MG PO TABS: 1.0000 | ORAL_TABLET | Freq: Every day | ORAL | 5 refills | Status: DC

## 2021-11-17 NOTE — Patient Instructions (Signed)

## 2021-11-17 NOTE — Progress Notes (Signed)
I,Tianna Badgett,acting as a Education administrator for [employer], FNP.,have documented all relevant documentation on the behalf of [name], FNP,as directed by  [name], FNP while in the presence of [name], [place].  This visit occurred during the SARS-CoV-2 public health emergency.  Safety protocols were in place, including screening questions prior to the visit, additional usage of staff PPE, and extensive cleaning of exam room while observing appropriate contact time as indicated for disinfecting solutions.  Subjective:     Patient ID: Katherine Arellano , female    DOB: Apr 20, 1976 , 46 y.o.   MRN: [id number]   Chief Complaint  Patient presents with   Annual Exam    HPI  Pt is here for HM. She dose not have a GYN but her PAP was done in 2022. LMP -11/02/2021 - regular. No tubal, no birth control. She has been to orthpedics who said she had runners knee.   Wt Readings from Last 3 Encounters: 11/17/21 : 282 lb (127.9 kg) 06/26/21 : 282 lb 6.4 oz (128.1 kg) 05/15/21 : 288 lb 6.4 oz (130.8 kg)      Past Medical History:  Diagnosis Date   High blood pressure      Family History  Problem Relation Age of Onset   High blood pressure Mother    Diabetes Father    High blood pressure Father    Cancer Father        prostate    Colon cancer Neg Hx    Esophageal cancer Neg Hx    Rectal cancer Neg Hx    Stomach cancer Neg Hx    Colon polyps Neg Hx      Current Outpatient Medications:    Magnesium 250 MG TABS, Take 1 tablet (250 mg total) by mouth daily. Take with evening meal, Disp: 30 tablet, Rfl: 5   amLODipine (NORVASC) 10 MG tablet, TAKE 1 TABLET BY MOUTH  DAILY, Disp: 90 tablet, Rfl: 3   CALCIUM PO, Take 600 mg by mouth daily at 2 am., Disp: , Rfl:    ferrous sulfate 325 (65 FE) MG tablet, Take 325 mg by mouth daily with breakfast., Disp: , Rfl:    Multiple Vitamin (MULTI-VITAMIN DAILY PO), Take by mouth., Disp: , Rfl:    VITAMIN D PO, Take 5,000 mg by mouth daily at 2 am., Disp: ,  Rfl:    No Known Allergies    The patient states she uses none for birth control. Last LMP was No LMP recorded.. Negative for Dysmenorrhea and Negative for Menorrhagia. Negative for: breast discharge, breast lump(s), breast pain and breast self exam. Associated symptoms include abnormal vaginal bleeding. Pertinent negatives include abnormal bleeding (hematology), anxiety, decreased libido, depression, difficulty falling sleep, dyspareunia, history of infertility, nocturia, sexual dysfunction, sleep disturbances, urinary incontinence, urinary urgency, vaginal discharge and vaginal itching. Diet regular; limiting her cheese and cholesterol intake. She is doing prepared meals. She is trying to drink more water. The patient states her exercise level is 6 days a week - MWF - one hour with HIIT, 2 days a week go to Refit class similar to Zumba and will walk during lunch. On Saturdays will walk 3 miles. She feels like she is down more in the waist. Since January 2023 she has been more consistent with her exercise. She has taken a break this weekend and this morning   The patient's tobacco use is:  Social History   Tobacco Use  Smoking Status Never  Smokeless Tobacco Never   She  has been exposed to passive smoke. The patient's alcohol use is:  Social History   Substance and Sexual Activity  Alcohol Use Not Currently   Additional information: Last pap 2022, next one scheduled for 2025.    Review of Systems  Constitutional: Negative.   HENT: Negative.    Eyes: Negative.   Respiratory: Negative.    Cardiovascular: Negative.   Gastrointestinal: Negative.   Endocrine: Negative.   Genitourinary: Negative.   Musculoskeletal: Negative.   Skin: Negative.   Allergic/Immunologic: Negative.   Neurological: Negative.   Hematological: Negative.   Psychiatric/Behavioral: Negative.      Today's Vitals   11/17/21 1450  BP: 130/70  Pulse: 87  Temp: 98.2 F (36.8 C)  TempSrc: Oral  Weight: 282 lb  (127.9 kg)  Height: 5' 5.6" (1.666 m)   Body mass index is 46.07 kg/m.   Objective:  Physical Exam Vitals reviewed.  Constitutional:      General: She is not in acute distress.    Appearance: Normal appearance. She is well-developed. She is obese.  HENT:     Head: Normocephalic and atraumatic.     Right Ear: Hearing, tympanic membrane, ear canal and external ear normal. There is no impacted cerumen.     Left Ear: Hearing, tympanic membrane, ear canal and external ear normal. There is no impacted cerumen.     Nose:     Comments: Deferred - masked    Mouth/Throat:     Comments: Deferred - masked Eyes:     General: Lids are normal.     Extraocular Movements: Extraocular movements intact.     Conjunctiva/sclera: Conjunctivae normal.     Pupils: Pupils are equal, round, and reactive to light.     Funduscopic exam:    Right eye: No papilledema.        Left eye: No papilledema.  Neck:     Thyroid: No thyroid mass.     Vascular: No carotid bruit.  Cardiovascular:     Rate and Rhythm: Normal rate and regular rhythm.     Pulses: Normal pulses.     Heart sounds: Normal heart sounds. No murmur heard. Pulmonary:     Effort: Pulmonary effort is normal. No respiratory distress.     Breath sounds: Normal breath sounds. No wheezing.  Chest:     Chest wall: No mass.  Breasts:    Tanner Score is 5.     Right: Normal. No mass or tenderness.     Left: Normal. No mass or tenderness.  Abdominal:     General: Abdomen is flat. Bowel sounds are normal. There is no distension.     Palpations: There is mass (firm mass to lower abdomen to pelvic area).     Tenderness: There is no abdominal tenderness.    Genitourinary:    General: Normal vulva.     Vagina: No vaginal discharge.     Cervix: Normal.     Uterus: Normal.      Adnexa: Right adnexa normal and left adnexa normal.       Right: No mass.         Left: No mass.       Rectum: Normal. Guaiac result negative (negative).   Musculoskeletal:        General: No swelling or tenderness. Normal range of motion.     Cervical back: Full passive range of motion without pain, normal range of motion and neck supple.     Right lower leg: No edema.  Left lower leg: No edema.  Lymphadenopathy:     Upper Body:     Right upper body: No supraclavicular, axillary or pectoral adenopathy.     Left upper body: No supraclavicular, axillary or pectoral adenopathy.  Skin:    General: Skin is warm and dry.     Capillary Refill: Capillary refill takes less than 2 seconds.     Coloration: Skin is not jaundiced.     Findings: No bruising.  Neurological:     General: No focal deficit present.     Mental Status: She is alert and oriented to person, place, and time.     Cranial Nerves: No cranial nerve deficit.     Sensory: No sensory deficit.     Motor: No weakness.  Psychiatric:        Mood and Affect: Mood normal.        Behavior: Behavior normal.        Thought Content: Thought content normal.        Judgment: Judgment normal.        Assessment And Plan:     1. Annual physical exam Behavior modifications discussed and diet history reviewed.   Pt will continue to exercise regularly and modify diet with low GI, plant based foods and decrease intake of processed foods.  Recommend intake of daily multivitamin, Vitamin D, and calcium.  Recommend mammogram (up to date) for preventive screenings, as well as recommend immunizations that includeTDAP  2. Essential hypertension Comments: Blood pressure is controlled, continue current medications  EKG done with NSR HR 75 - POCT Urinalysis Dipstick (81002) - Microalbumin / Creatinine Urine Ratio - EKG 12-Lead - CMP14+EGFR  3. Elevated cholesterol Comments: Diet controlled, continue current medications - CMP14+EGFR - Lipid panel  4. Pelvic mass Comments: She has a firm pelvic mass low abdomen area, will check an ultrasound. Denies abnormal vaginal bleeding or heavy  menses. - US Pelvic Complete With Transvaginal; Future  5. Pain in both lower extremities Comments: No pain on physical exam, she is encouraged to take Magnesium 250 mg with evening meal. Will also refer to PT for strengthening exercises - Ambulatory referral to Physical Therapy - Magnesium 250 MG TABS; Take 1 tablet (250 mg total) by mouth daily. Take with evening meal  Dispense: 30 tablet; Refill: 5  6. Morbid obesity (HCC) Chronic Discussed healthy diet and regular exercise options  Encouraged to contiue exercising at least 150 minutes per week with 2 days of strength training - Hemoglobin A1c  7. Other long term (current) drug therapy - CBC     Patient was given opportunity to ask questions. Patient verbalized understanding of the plan and was able to repeat key elements of the plan. All questions were answered to their satisfaction.   [name], FNP   I, [name], FNP, have reviewed all documentation for this visit. The documentation on 11/17/21 for the exam, diagnosis, procedures, and orders are all accurate and complete.  THE PATIENT IS ENCOURAGED TO PRACTICE SOCIAL DISTANCING DUE TO THE COVID-19 PANDEMIC.

## 2021-11-18 ENCOUNTER — Ambulatory Visit
Admission: RE | Admit: 2021-11-18 | Discharge: 2021-11-18 | Disposition: A | Discharge status: Home / Self Care | Payer: 59 | Source: Ambulatory Visit | Attending: Nurse Practitioner | Admitting: Nurse Practitioner

## 2021-11-18 ENCOUNTER — Other Ambulatory Visit: Payer: Self-pay | Admitting: Nurse Practitioner

## 2021-11-18 DIAGNOSIS — D259 Leiomyoma of uterus, unspecified: Secondary | ICD-10-CM

## 2021-11-18 DIAGNOSIS — R1900 Intra-abdominal and pelvic swelling, mass and lump, unspecified site: Secondary | ICD-10-CM

## 2021-11-18 LAB — CBC
Hematocrit: 35.7 % (ref 34.0–46.6)
Hemoglobin: 12.1 g/dL (ref 11.1–15.9)
MCH: 29.7 pg (ref 26.6–33.0)
MCHC: 33.9 g/dL (ref 31.5–35.7)
MCV: 88 fL (ref 79–97)
Platelets: 356 10*3/uL (ref 150–450)
RBC: 4.07 x10E6/uL (ref 3.77–5.28)
RDW: 12.5 % (ref 11.7–15.4)
WBC: 6.6 10*3/uL (ref 3.4–10.8)

## 2021-11-18 LAB — CMP14+EGFR
ALT: 8 IU/L (ref 0–32)
AST: 12 IU/L (ref 0–40)
Albumin/Globulin Ratio: 1.4 (ref 1.2–2.2)
Albumin: 4.2 g/dL (ref 3.8–4.8)
Alkaline Phosphatase: 52 IU/L (ref 44–121)
BUN/Creatinine Ratio: 8 — ABNORMAL LOW (ref 9–23)
BUN: 6 mg/dL (ref 6–24)
Bilirubin Total: 0.4 mg/dL (ref 0.0–1.2)
CO2: 23 mmol/L (ref 20–29)
Calcium: 8.9 mg/dL (ref 8.7–10.2)
Chloride: 102 mmol/L (ref 96–106)
Creatinine, Ser: 0.75 mg/dL (ref 0.57–1.00)
Globulin, Total: 2.9 g/dL (ref 1.5–4.5)
Glucose: 81 mg/dL (ref 70–99)
Potassium: 4 mmol/L (ref 3.5–5.2)
Sodium: 135 mmol/L (ref 134–144)
Total Protein: 7.1 g/dL (ref 6.0–8.5)
eGFR: 99 mL/min/{1.73_m2} (ref >59)

## 2021-11-18 LAB — LIPID PANEL
Chol/HDL Ratio: 3.6 ratio (ref 0.0–4.4)
Cholesterol, Total: 171 mg/dL (ref 100–199)
HDL: 47 mg/dL (ref >39)
LDL Chol Calc (NIH): 110 mg/dL — ABNORMAL HIGH (ref 0–99)
Triglycerides: 76 mg/dL (ref 0–149)
VLDL Cholesterol Cal: 14 mg/dL (ref 5–40)

## 2021-11-18 LAB — HEMOGLOBIN A1C
Est. average glucose Bld gHb Est-mCnc: 100 mg/dL
Hgb A1c MFr Bld: 5.1 % (ref 4.8–5.6)

## 2021-11-18 LAB — MICROALBUMIN / CREATININE URINE RATIO
Creatinine, Urine: 37 mg/dL
Microalb/Creat Ratio: <8 mg/g creat (ref 0–29)
Microalbumin, Urine: <3 ug/mL

## 2021-11-20 ENCOUNTER — Encounter: Payer: Self-pay | Admitting: Nurse Practitioner

## 2021-12-15 ENCOUNTER — Encounter: Payer: Self-pay | Admitting: Nurse Practitioner

## 2022-01-01 ENCOUNTER — Ambulatory Visit (INDEPENDENT_AMBULATORY_CARE_PROVIDER_SITE_OTHER): Payer: 59 | Admitting: Obstetrics and Gynecology

## 2022-01-01 ENCOUNTER — Encounter: Payer: Self-pay | Admitting: Obstetrics and Gynecology

## 2022-01-01 DIAGNOSIS — D219 Benign neoplasm of connective and other soft tissue, unspecified: Secondary | ICD-10-CM | POA: Diagnosis not present

## 2022-01-01 NOTE — Progress Notes (Signed)
  CC: uterine fibroids Subjective:    Patient ID: Katherine Arellano, female    DOB: Dec 10, 1975, 46 y.o.   MRN: [id number]  HPI 46 yo G0 seen for evaluation and discussion of uterine fibroids.  Fibroids were detected on ultrasound after hard masses were felt abdominally.She has regular menses lasting 7 days, first 3-4 days are heaviest.  She does note bladder pressure with equivocal increase in urination.   Review of Systems  Genitourinary:  Positive for frequency. Negative for menstrual problem.       Pelvic mass       Objective:   Physical Exam Vitals:   01/01/22 1526  BP: 134/86  Pulse: 78   Abd: uterus can be palpated transabdominally, very prominent Fundus at 16-17 weeks Wide bulky uterus detected Bimanual exam confirms large bulky uterus  CLINICAL DATA:  Pelvic mass on physical exam.   EXAM: TRANSABDOMINAL AND TRANSVAGINAL ULTRASOUND OF PELVIS   TECHNIQUE: Both transabdominal and transvaginal ultrasound examinations of the pelvis were performed. Transabdominal technique was performed for global imaging of the pelvis including uterus, ovaries, adnexal regions, and pelvic cul-de-sac. It was necessary to proceed with endovaginal exam following the transabdominal exam to visualize the adnexal regions.   COMPARISON:  None Available.   FINDINGS: Uterus   Measurements: 19.7 x 11.4 x 10.2 cm = volume: 1189 ML. 7.4 x 7.1 x 6.1 cm fibroid is noted posteriorly. 2.4 x 1.9 x 1.3 cm fibroid is noted anteriorly.   Endometrium   Thickness: 13 mm which is within normal limits. No focal abnormality visualized.   Right ovary   Measurements: 4.4 x 2.1 x 2.1 cm = volume: 10 mL. Normal appearance/no adnexal mass.   Left ovary   Measurements: 5.4 x 3.5 x 3.0 cm = volume: 29 mL. 4.1 cm cyst is noted.   Other findings   No abnormal free fluid.   IMPRESSION: At least 2 uterine fibroids are noted, the largest measuring 7.4 cm in the posterior portion of the uterus.   4.1 cm  left ovarian cyst. No follow up imaging recommended. Note: This recommendation does not apply to premenarchal patients or to those with increased risk (genetic, family history, elevated tumor markers or other high-risk factors) of ovarian cancer. Reference: Radiology 2019 Nov; 293(2):359-371.    Assessment & Plan:   Fibroids  Discussed treatment options including UFE, Sonata or hysterectomy. Believe Sonata may be difficult due to nulliparity and narrow vagina.  Dominant fibroid also nearing max capacity for device.  Best option may be hysterectomy, Due to size, abdominal approach may be best.  Will discuss with team to see if laparoscopic approach is possible.  Virtual visit in 3-4 weeks to discuss choice with patient.   [name], MD Faculty Attending, [facility]

## 2022-01-01 NOTE — Progress Notes (Signed)
New GYN is in the office to establish care and follow up after U/S on 11/18/21 that showed fibroids. Last pap 11/12/2020 Last mammogram 05/30/2021 Pt denies pain today Reports regular cycles, LMP 12/25/21

## 2022-02-04 ENCOUNTER — Encounter (INDEPENDENT_AMBULATORY_CARE_PROVIDER_SITE_OTHER): Payer: Self-pay

## 2022-02-12 ENCOUNTER — Telehealth (INDEPENDENT_AMBULATORY_CARE_PROVIDER_SITE_OTHER): Payer: 59 | Admitting: Obstetrics and Gynecology

## 2022-02-12 ENCOUNTER — Encounter: Payer: Self-pay | Admitting: Obstetrics and Gynecology

## 2022-02-12 DIAGNOSIS — D219 Benign neoplasm of connective and other soft tissue, unspecified: Secondary | ICD-10-CM

## 2022-02-12 MED ORDER — MISOPROSTOL 100 MCG PO TABS: ORAL_TABLET | ORAL | 0 refills | Status: DC

## 2022-02-12 NOTE — Progress Notes (Signed)
Virtual Visit via Telephone Note  I connected with Katherine Arellano on 02/12/22 at  8:55 AM EDT by telephone and verified that I am speaking with the correct person using two identifiers.  Location: Patient: Home Provider: [facility]   Pt requests laparoscopic hysterectomy.

## 2022-02-12 NOTE — Progress Notes (Addendum)
    GYNECOLOGY VIRTUAL VISIT ENCOUNTER NOTE  Provider location: Center for [place] at [facility]   Patient location: Home  I connected with Katherine Arellano on 02/12/22 at  8:55 AM EDT by MyChart Video Encounter and verified that I am speaking with the correct person using two identifiers.   I discussed the limitations, risks, security and privacy concerns of performing an evaluation and management service virtually and the availability of in person appointments. I also discussed with the patient that there may be a patient responsible charge related to this service. The patient expressed understanding and agreed to proceed.   History:  Katherine Arellano is a 46 y.o. G0P0000 female being evaluated today for possibility of surgical intervention regarding her fibroid uterus. She denies any abnormal vaginal discharge, pelvic pain or other concerns.  PT still notes prolonged vaginal bleeding.  Discussed with patient that I had spoken with Dr. Sabra Heck and that due to the size and bulk of the uterus she was not a candidate for TLH.  Did tell pt would reach out to [facility]. [facility] to see if robotic hysterectomy could be an option.  She will likely be abdominal hysterectomy.  Discussed in detail recovery from abdominal hyst and that UFE is still a valid option.  Pt prefers hysterectomy at this time.     Past Medical History:  Diagnosis Date   High blood pressure    Past Surgical History:  Procedure Laterality Date   CYST EXCISION     wrist   TONSILECTOMY/ADENOIDECTOMY WITH MYRINGOTOMY     The following portions of the patient's history were reviewed and updated as appropriate: allergies, current medications, past family history, past medical history, past social history, past surgical history and problem list.   Health Maintenance:  Normal pap and negative HRHPV on 5/22.  Normal mammogram on 05/30/21.   Review of Systems:  Pertinent items noted in HPI and remainder of comprehensive ROS otherwise  negative.  Physical Exam:   General:  Alert, oriented and cooperative. Patient appears to be in no acute distress.  Mental Status: Normal mood and affect. Normal behavior. Normal judgment and thought content.   Respiratory: Normal respiratory effort, no problems with respiration noted  Rest of physical exam deferred due to type of encounter  Labs and Imaging No results found for this or any previous visit (from the past 336 hour(s)). No results found.     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 Will schedule case and consider depo-lupron to help decrease uterine size prior to procedure. Pt to get endometrial biopsy in 3 weeks.  Pt advised to premedicate before procedure, will send RX for cytotec to aid in the procedure.      I discussed the assessment and treatment plan with the patient. The patient was provided an opportunity to ask questions and all were answered. The patient agreed with the plan and demonstrated an understanding of the instructions.   The patient was advised to call back or seek an in-person evaluation/go to the ED if the symptoms worsen or if the condition fails to improve as anticipated.  I provided 20 minutes of face-to-face time during this encounter.   [name], MD Center for [employer], [place]

## 2022-02-13 ENCOUNTER — Other Ambulatory Visit: Payer: Self-pay

## 2022-02-13 ENCOUNTER — Telehealth: Payer: Self-pay

## 2022-02-13 DIAGNOSIS — D219 Benign neoplasm of connective and other soft tissue, unspecified: Secondary | ICD-10-CM

## 2022-02-13 MED ORDER — MISOPROSTOL 100 MCG PO TABS: ORAL_TABLET | ORAL | 0 refills | Status: DC

## 2022-02-13 NOTE — Telephone Encounter (Signed)
LVMM to call us with the name of a local Pharmacy to send her Rx to before the time of her procedure, because her Mail order [facility] cannot fill Rx.

## 2022-02-13 NOTE — Progress Notes (Signed)
Cytotec sent to [facility] on [address]

## 2022-02-20 ENCOUNTER — Telehealth: Payer: Self-pay

## 2022-02-20 NOTE — Telephone Encounter (Signed)
Called patient to inform her that her FMLA paperwork was finished, and that she just needed to pay the fee for [place] to fax it. Call forwarded to [facility] for payment intake. Paperwork faxed 02/20/22.

## 2022-03-09 ENCOUNTER — Telehealth: Payer: Self-pay | Admitting: General Practice

## 2022-03-09 NOTE — Addendum Note (Signed)
Addended by: [name] on: 03/09/2022 06:08 PM   Modules accepted: Orders

## 2022-03-09 NOTE — Telephone Encounter (Signed)
Called pt to reschedule appt that was scheduled with Dr. Ihor Dow for surgical consultation d/t provider will be on medical leave.  Pt stated that she is scheduled for surgery on 04/07/2022 with Dr. Elgie Congo and that she will proceed with surgery on that day.  Did not wish to reschedule.

## 2022-03-11 ENCOUNTER — Encounter: Payer: Self-pay | Admitting: Obstetrics and Gynecology

## 2022-03-12 ENCOUNTER — Other Ambulatory Visit: Payer: Self-pay | Admitting: Emergency Medicine

## 2022-03-12 DIAGNOSIS — R52 Pain, unspecified: Secondary | ICD-10-CM

## 2022-03-12 MED ORDER — IBUPROFEN 800 MG PO TABS: 800.0000 mg | ORAL_TABLET | Freq: Three times a day (TID) | ORAL | 1 refills | Status: DC | PRN

## 2022-03-12 NOTE — Progress Notes (Signed)
Rx for Ibuprofen for EMB on 9/15.

## 2022-03-13 ENCOUNTER — Ambulatory Visit (INDEPENDENT_AMBULATORY_CARE_PROVIDER_SITE_OTHER): Payer: 59 | Admitting: Obstetrics and Gynecology

## 2022-03-13 ENCOUNTER — Other Ambulatory Visit (HOSPITAL_COMMUNITY)
Admission: RE | Admit: 2022-03-13 | Discharge: 2022-03-13 | Disposition: A | Discharge status: Home / Self Care | Payer: 59 | Source: Ambulatory Visit | Attending: Obstetrics and Gynecology | Admitting: Obstetrics and Gynecology

## 2022-03-13 ENCOUNTER — Encounter: Payer: Self-pay | Admitting: Obstetrics and Gynecology

## 2022-03-13 VITALS — BP 135/82 | HR 78 | Ht 66.0 in | Wt 280.4 lb

## 2022-03-13 DIAGNOSIS — D219 Benign neoplasm of connective and other soft tissue, unspecified: Secondary | ICD-10-CM | POA: Insufficient documentation

## 2022-03-13 DIAGNOSIS — N920 Excessive and frequent menstruation with regular cycle: Secondary | ICD-10-CM | POA: Diagnosis not present

## 2022-03-13 DIAGNOSIS — D500 Iron deficiency anemia secondary to blood loss (chronic): Secondary | ICD-10-CM

## 2022-03-13 NOTE — H&P (View-Only) (Signed)
OB/GYN Pre-Op History and Physical  Katherine Arellano is a 46 y.o. G0P0000 presenting for preoperative appointment and endometrial biopsy.   Pt is scheduled for total abdominal hysterectomy with bilateral salpingectomy, along with TAP block if possible.  Risks and benefits of the procedure were given including bleeding, infection, involvement of other organs such as bladder and bowel.  Chart reviewed and patient has normal pap in 2022. See separate note for endometrial biopsy.      Past Medical History:  Diagnosis Date   High blood pressure     Past Surgical History:  Procedure Laterality Date   CYST EXCISION     wrist   TONSILECTOMY/ADENOIDECTOMY WITH MYRINGOTOMY      OB History  Gravida Para Term Preterm AB Living  0 0 0 0 0 0  SAB IAB Ectopic Multiple Live Births  0 0 0 0 0    Social History   Socioeconomic History   Marital status: Single    Spouse name: Not on file   Number of children: Not on file   Years of education: Not on file   Highest education level: Not on file  Occupational History   Occupation: Implementation Paramedic  Tobacco Use   Smoking status: Never   Smokeless tobacco: Never  Vaping Use   Vaping Use: Never used  Substance and Sexual Activity   Alcohol use: Not Currently   Drug use: Never   Sexual activity: Not Currently  Other Topics Concern   Not on file  Social History Narrative   Not on file   Social Determinants of Health   Financial Resource Strain: Not on file  Food Insecurity: Not on file  Transportation Needs: Not on file  Physical Activity: Not on file  Stress: Not on file  Social Connections: Not on file    Family History  Problem Relation Age of Onset   High blood pressure Mother    Diabetes Father    High blood pressure Father    Cancer Father        prostate    Colon cancer Neg Hx    Esophageal cancer Neg Hx    Rectal cancer Neg Hx    Stomach cancer Neg Hx    Colon polyps Neg Hx     (Not in a hospital  admission)   No Known Allergies  Review of Systems: Negative except for what is mentioned in HPI.     Physical Exam: BP 135/82   Pulse 78   Ht '5\' 6"'$  (1.676 m)   Wt 280 lb 6.4 oz (127.2 kg)   LMP 03/07/2022   BMI 45.26 kg/m  CONSTITUTIONAL: Well-developed, obese, well-nourished female in no acute distress.  HENT:  Normocephalic, atraumatic, External right and left ear normal. Oropharynx is clear and moist EYES: Conjunctivae and EOM are normal.  NECK: Normal range of motion, supple, no masses SKIN: Skin is warm and dry. No rash noted. Not diaphoretic. No erythema. No pallor. [place]: Alert and oriented to person, place, and time. Normal reflexes, muscle tone coordination. No cranial nerve deficit noted. PSYCHIATRIC: Normal mood and affect. Normal behavior. Normal judgment and thought content. CARDIOVASCULAR: Normal heart rate noted, regular rhythm RESPIRATORY: Effort and breath sounds normal, no problems with respiration noted ABDOMEN: Soft, nontender, nondistended,palpable abdominal mass c/w fibroid noted PELVIC: Large uterine mass, mobile c/w fibroid uterus. MUSCULOSKELETAL: Normal range of motion. No edema and no tenderness. 2+ distal pulses.   Pertinent Labs/Studies:   CLINICAL DATA:  Pelvic mass on physical exam.  EXAM: TRANSABDOMINAL AND TRANSVAGINAL ULTRASOUND OF PELVIS   TECHNIQUE: Both transabdominal and transvaginal ultrasound examinations of the pelvis were performed. Transabdominal technique was performed for global imaging of the pelvis including uterus, ovaries, adnexal regions, and pelvic cul-de-sac. It was necessary to proceed with endovaginal exam following the transabdominal exam to visualize the adnexal regions.   COMPARISON:  None Available.   FINDINGS: Uterus   Measurements: 19.7 x 11.4 x 10.2 cm = volume: 1189 ML. 7.4 x 7.1 x 6.1 cm fibroid is noted posteriorly. 2.4 x 1.9 x 1.3 cm fibroid is noted anteriorly.   Endometrium   Thickness: 13  mm which is within normal limits. No focal abnormality visualized.   Right ovary   Measurements: 4.4 x 2.1 x 2.1 cm = volume: 10 mL. Normal appearance/no adnexal mass.   Left ovary   Measurements: 5.4 x 3.5 x 3.0 cm = volume: 29 mL. 4.1 cm cyst is noted.   Other findings   No abnormal free fluid.   IMPRESSION: At least 2 uterine fibroids are noted, the largest measuring 7.4 cm in the posterior portion of the uterus.   4.1 cm left ovarian cyst. No follow up imaging recommended. Note: This recommendation does not apply to premenarchal patients or to those with increased risk (genetic, family history, elevated tumor markers or other high-risk factors) of ovarian cancer. Reference: Radiology 2019 Nov; 293(2):359-371.      Assessment and Plan :Katherine Arellano is a 46 y.o. G0P0000 here for preoperative visit.   Plan for total abdominal hysterectomy with bilateral salpingectomy along with TAP block NPO Admission labs ordered VS per routine Discussed hospital stay and routine postoperative care.   Lynnda Shields, M.D. Attending [place], [facility] for [employer], [place]

## 2022-03-13 NOTE — Progress Notes (Signed)
OB/GYN Pre-Op History and Physical  Katherine Arellano is a 45 y.o. G0P0000 presenting for preoperative appointment and endometrial biopsy.   Pt is scheduled for total abdominal hysterectomy with bilateral salpingectomy, along with TAP block if possible.  Risks and benefits of the procedure were given including bleeding, infection, involvement of other organs such as bladder and bowel.  Chart reviewed and patient has normal pap in 2022. See separate note for endometrial biopsy.      Past Medical History:  Diagnosis Date   High blood pressure     Past Surgical History:  Procedure Laterality Date   CYST EXCISION     wrist   TONSILECTOMY/ADENOIDECTOMY WITH MYRINGOTOMY      OB History  Gravida Para Term Preterm AB Living  0 0 0 0 0 0  SAB IAB Ectopic Multiple Live Births  0 0 0 0 0    Social History   Socioeconomic History   Marital status: Single    Spouse name: Not on file   Number of children: Not on file   Years of education: Not on file   Highest education level: Not on file  Occupational History   Occupation: Implementation Paramedic  Tobacco Use   Smoking status: Never   Smokeless tobacco: Never  Vaping Use   Vaping Use: Never used  Substance and Sexual Activity   Alcohol use: Not Currently   Drug use: Never   Sexual activity: Not Currently  Other Topics Concern   Not on file  Social History Narrative   Not on file   Social Determinants of Health   Financial Resource Strain: Not on file  Food Insecurity: Not on file  Transportation Needs: Not on file  Physical Activity: Not on file  Stress: Not on file  Social Connections: Not on file    Family History  Problem Relation Age of Onset   High blood pressure Mother    Diabetes Father    High blood pressure Father    Cancer Father        prostate    Colon cancer Neg Hx    Esophageal cancer Neg Hx    Rectal cancer Neg Hx    Stomach cancer Neg Hx    Colon polyps Neg Hx     (Not in a hospital  admission)   No Known Allergies  Review of Systems: Negative except for what is mentioned in HPI.     Physical Exam: BP 135/82   Pulse 78   Ht '5\' 6"'$  (1.676 m)   Wt 280 lb 6.4 oz (127.2 kg)   LMP 03/07/2022   BMI 45.26 kg/m  CONSTITUTIONAL: Well-developed, obese, well-nourished female in no acute distress.  HENT:  Normocephalic, atraumatic, External right and left ear normal. Oropharynx is clear and moist EYES: Conjunctivae and EOM are normal.  NECK: Normal range of motion, supple, no masses SKIN: Skin is warm and dry. No rash noted. Not diaphoretic. No erythema. No pallor. [place]: Alert and oriented to person, place, and time. Normal reflexes, muscle tone coordination. No cranial nerve deficit noted. PSYCHIATRIC: Normal mood and affect. Normal behavior. Normal judgment and thought content. CARDIOVASCULAR: Normal heart rate noted, regular rhythm RESPIRATORY: Effort and breath sounds normal, no problems with respiration noted ABDOMEN: Soft, nontender, nondistended,palpable abdominal mass c/w fibroid noted PELVIC: Large uterine mass, mobile c/w fibroid uterus. MUSCULOSKELETAL: Normal range of motion. No edema and no tenderness. 2+ distal pulses.   Pertinent Labs/Studies:   CLINICAL DATA:  Pelvic mass on physical exam.  EXAM: TRANSABDOMINAL AND TRANSVAGINAL ULTRASOUND OF PELVIS   TECHNIQUE: Both transabdominal and transvaginal ultrasound examinations of the pelvis were performed. Transabdominal technique was performed for global imaging of the pelvis including uterus, ovaries, adnexal regions, and pelvic cul-de-sac. It was necessary to proceed with endovaginal exam following the transabdominal exam to visualize the adnexal regions.   COMPARISON:  None Available.   FINDINGS: Uterus   Measurements: 19.7 x 11.4 x 10.2 cm = volume: 1189 ML. 7.4 x 7.1 x 6.1 cm fibroid is noted posteriorly. 2.4 x 1.9 x 1.3 cm fibroid is noted anteriorly.   Endometrium   Thickness: 13  mm which is within normal limits. No focal abnormality visualized.   Right ovary   Measurements: 4.4 x 2.1 x 2.1 cm = volume: 10 mL. Normal appearance/no adnexal mass.   Left ovary   Measurements: 5.4 x 3.5 x 3.0 cm = volume: 29 mL. 4.1 cm cyst is noted.   Other findings   No abnormal free fluid.   IMPRESSION: At least 2 uterine fibroids are noted, the largest measuring 7.4 cm in the posterior portion of the uterus.   4.1 cm left ovarian cyst. No follow up imaging recommended. Note: This recommendation does not apply to premenarchal patients or to those with increased risk (genetic, family history, elevated tumor markers or other high-risk factors) of ovarian cancer. Reference: Radiology 2019 Nov; 293(2):359-371.      Assessment and Plan :Katherine Arellano is a 46 y.o. G0P0000 here for preoperative visit.   Plan for total abdominal hysterectomy with bilateral salpingectomy along with TAP block NPO Admission labs ordered VS per routine Discussed hospital stay and routine postoperative care.   Lynnda Shields, M.D. Attending [place], [facility] for [employer], [place]

## 2022-03-13 NOTE — Progress Notes (Signed)
ENDOMETRIAL BIOPSY      Katherine Arellano is a 46 y.o. G0P0000 here for endometrial biopsy.  The indications for endometrial biopsy were reviewed.  Risks of the biopsy including cramping, bleeding, infection, uterine perforation, inadequate specimen and need for additional procedures were discussed. The patient states she understands and agrees to undergo procedure today. Consent was signed. Time out was performed.   Indications: menorrhagia, preop for TAH Urine HCG: neg  A bivalve speculum was placed into the vagina and the cervix was easily visualized and was prepped with Betadine x2. A single-toothed tenaculum was placed on the anterior lip of the cervix to stabilize it. The 3 mm pipelle was introduced into the endometrial cavity without difficulty to a depth of 11 cm, and a moderate amount of tissue was obtained and sent to pathology. This was repeated for a total of 2 passes. The instruments were removed from the patient's vagina. Minimal bleeding from the cervix at the tenaculum was noted.   The patient tolerated the procedure well. Routine post-procedure instructions were given to the patient.    Will base further management on results of biopsy.  Lynnda Shields, MD Faculty attending Center for [facility]

## 2022-03-17 LAB — SURGICAL PATHOLOGY

## 2022-03-18 ENCOUNTER — Institutional Professional Consult (permissible substitution): Payer: 59 | Admitting: Obstetrics & Gynecology

## 2022-03-27 NOTE — Progress Notes (Signed)
Surgical Instructions    Your procedure is scheduled on Tuesday, 04/07/22.  Report to [facility] Main Entrance "A" at 7:50 A.M., then check in with the Admitting office.  Call this number if you have problems the morning of surgery:  [phone]   If you have any questions prior to your surgery date call [phone]: Open Monday-Friday 8am-4pm If you experience any cold or flu symptoms such as cough, fever, chills, shortness of breath, etc. between now and your scheduled surgery, please notify us at the above number     Remember:  Do not eat or drink after midnight the night before your surgery     Take these medicines the morning of surgery with A SIP OF WATER:  amLODipine (NORVASC)  misoprostol (CYTOTEC) - take as instructed  As of today, STOP taking any Aspirin (unless otherwise instructed by your surgeon) Aleve, Naproxen, Ibuprofen, Motrin, Advil, Goody's, BC's, all herbal medications, fish oil, and all vitamins.           Do not wear jewelry or makeup. Do not wear lotions, powders, perfumes/cologne or deodorant. Do not shave 48 hours prior to surgery.   Do not bring valuables to the hospital. Do not wear nail polish, gel polish, artificial nails, or any other type of covering on natural nails (fingers and toes) If you have artificial nails or gel coating that need to be removed by a nail salon, please have this removed prior to surgery. Artificial nails or gel coating may interfere with anesthesia's ability to adequately monitor your vital signs.  [facility] is not responsible for any belongings or valuables.    Do NOT Smoke (Tobacco/Vaping)  24 hours prior to your procedure  If you use a CPAP at night, you may bring your mask for your overnight stay.   Contacts, glasses, hearing aids, dentures or partials may not be worn into surgery, please bring cases for these belongings   For patients admitted to the hospital, discharge time will be determined by your treatment  team.   Patients discharged the day of surgery will not be allowed to drive home, and someone needs to stay with them for 24 hours.   SURGICAL WAITING ROOM VISITATION Patients having surgery or a procedure may have no more than 2 support people in the waiting area - these visitors may rotate.   Children under the age of 110 must have an adult with them who is not the patient. If the patient needs to stay at the hospital during part of their recovery, the visitor guidelines for inpatient rooms apply. Pre-op nurse will coordinate an appropriate time for 1 support person to accompany patient in pre-op.  This support person may not rotate.   Please refer to the [facility] website for the visitor guidelines for Inpatients (after your surgery is over and you are in a regular room).    Special instructions:    Oral Hygiene is also important to reduce your risk of infection.  Remember - BRUSH YOUR TEETH THE MORNING OF SURGERY WITH YOUR REGULAR TOOTHPASTE   [facility]- Preparing For Surgery  Before surgery, you can play an important role. Because skin is not sterile, your skin needs to be as free of germs as possible. You can reduce the number of germs on your skin by washing with CHG (chlorahexidine gluconate) Soap before surgery.  CHG is an antiseptic cleaner which kills germs and bonds with the skin to continue killing germs even after washing.     Please  do not use if you have an allergy to CHG or antibacterial soaps. If your skin becomes reddened/irritated stop using the CHG.  Do not shave (including legs and underarms) for at least 48 hours prior to first CHG shower. It is OK to shave your face.  Please follow these instructions carefully.     Shower the NIGHT BEFORE SURGERY and the MORNING OF SURGERY with CHG Soap.   If you chose to wash your hair, wash your hair first as usual with your normal shampoo. After you shampoo, rinse your hair and body thoroughly to remove the shampoo.  Then  [employer] and genitals (private parts) with your normal soap and rinse thoroughly to remove soap.  After that Use CHG Soap as you would any other liquid soap. You can apply CHG directly to the skin and wash gently with a scrungie or a clean washcloth.   Apply the CHG Soap to your body ONLY FROM THE NECK DOWN.  Do not use on open wounds or open sores. Avoid contact with your eyes, ears, mouth and genitals (private parts). Wash Face and genitals (private parts)  with your normal soap.   Wash thoroughly, paying special attention to the area where your surgery will be performed.  Thoroughly rinse your body with warm water from the neck down.  DO NOT shower/wash with your normal soap after using and rinsing off the CHG Soap.  Pat yourself dry with a CLEAN TOWEL.  Wear CLEAN PAJAMAS to bed the night before surgery  Place CLEAN SHEETS on your bed the night before your surgery  DO NOT SLEEP WITH PETS.   Day of Surgery: Take a shower with CHG soap. Wear Clean/Comfortable clothing the morning of surgery Do not apply any deodorants/lotions.   Remember to brush your teeth WITH YOUR REGULAR TOOTHPASTE.    If you received a COVID test during your pre-op visit, it is requested that you wear a mask when out in public, stay away from anyone that may not be feeling well, and notify your surgeon if you develop symptoms. If you have been in contact with anyone that has tested positive in the last 10 days, please notify your surgeon.    Please read over the following fact sheets that you were given.

## 2022-03-30 ENCOUNTER — Encounter (HOSPITAL_COMMUNITY): Payer: Self-pay

## 2022-03-30 ENCOUNTER — Encounter (HOSPITAL_COMMUNITY)
Admission: RE | Admit: 2022-03-30 | Discharge: 2022-03-30 | Disposition: A | Discharge status: Home / Self Care | Payer: 59 | Source: Ambulatory Visit | Attending: Obstetrics and Gynecology | Admitting: Obstetrics and Gynecology

## 2022-03-30 ENCOUNTER — Other Ambulatory Visit: Payer: Self-pay

## 2022-03-30 VITALS — BP 152/85 | HR 91 | Temp 98.7°F | Resp 18 | Ht 66.0 in | Wt 285.2 lb

## 2022-03-30 DIAGNOSIS — Z01818 Encounter for other preprocedural examination: Secondary | ICD-10-CM

## 2022-03-30 DIAGNOSIS — D219 Benign neoplasm of connective and other soft tissue, unspecified: Secondary | ICD-10-CM | POA: Diagnosis not present

## 2022-03-30 DIAGNOSIS — D500 Iron deficiency anemia secondary to blood loss (chronic): Secondary | ICD-10-CM | POA: Diagnosis not present

## 2022-03-30 DIAGNOSIS — Z01812 Encounter for preprocedural laboratory examination: Secondary | ICD-10-CM | POA: Insufficient documentation

## 2022-03-30 LAB — CBC WITH DIFFERENTIAL/PLATELET
Abs Immature Granulocytes: 0.02 10*3/uL (ref 0.00–0.07)
Basophils Absolute: 0 10*3/uL (ref 0.0–0.1)
Basophils Relative: 1 %
Eosinophils Absolute: 0.1 10*3/uL (ref 0.0–0.5)
Eosinophils Relative: 1 %
HCT: 36.1 % (ref 36.0–46.0)
Hemoglobin: 11.9 g/dL — ABNORMAL LOW (ref 12.0–15.0)
Immature Granulocytes: 0 %
Lymphocytes Relative: 34 %
Lymphs Abs: 2 10*3/uL (ref 0.7–4.0)
MCH: 29.5 pg (ref 26.0–34.0)
MCHC: 33 g/dL (ref 30.0–36.0)
MCV: 89.4 fL (ref 80.0–100.0)
Monocytes Absolute: 0.5 10*3/uL (ref 0.1–1.0)
Monocytes Relative: 8 %
Neutro Abs: 3.4 10*3/uL (ref 1.7–7.7)
Neutrophils Relative %: 56 %
Platelets: 351 10*3/uL (ref 150–400)
RBC: 4.04 MIL/uL (ref 3.87–5.11)
RDW: 13.2 % (ref 11.5–15.5)
WBC: 6 10*3/uL (ref 4.0–10.5)
nRBC: 0 % (ref 0.0–0.2)

## 2022-03-30 LAB — BASIC METABOLIC PANEL
Anion gap: 4 — ABNORMAL LOW (ref 5–15)
BUN: 6 mg/dL (ref 6–20)
CO2: 24 mmol/L (ref 22–32)
Calcium: 9 mg/dL (ref 8.9–10.3)
Chloride: 109 mmol/L (ref 98–111)
Creatinine, Ser: 0.85 mg/dL (ref 0.44–1.00)
GFR, Estimated: >60 mL/min (ref >60)
Glucose, Bld: 83 mg/dL (ref 70–99)
Potassium: 3.9 mmol/L (ref 3.5–5.1)
Sodium: 137 mmol/L (ref 135–145)

## 2022-03-30 NOTE — Progress Notes (Signed)
PCP - Katherine Arellano Cardiologist - denies  PPM/ICD - denies   Chest x-ray - n/a EKG - 11/17/21 Stress Test - denies ECHO - denies Cardiac Cath - denies  Sleep Study - denies   No diabetes  Follow your surgeon's instructions on when to stop Aspirin.  If no instructions were given by your surgeon then you will need to call the office to get those instructions.     ERAS Protcol -no   COVID TEST- not needed   Anesthesia review: no  Patient denies shortness of breath, fever, cough and chest pain at PAT appointment   All instructions explained to the patient, with a verbal understanding of the material. Patient agrees to go over the instructions while at home for a better understanding. Patient also instructed to self quarantine after being tested for COVID-19. The opportunity to ask questions was provided.

## 2022-04-01 NOTE — Progress Notes (Signed)
Pt will need a new type and screen day of surgery.

## 2022-04-07 ENCOUNTER — Encounter (HOSPITAL_COMMUNITY)
Admission: RE | Disposition: A | Discharge status: Home / Self Care | Payer: Self-pay | Source: Home / Self Care | Attending: Obstetrics and Gynecology

## 2022-04-07 ENCOUNTER — Inpatient Hospital Stay (HOSPITAL_COMMUNITY): Payer: 59 | Admitting: Certified Registered Nurse Anesthetist

## 2022-04-07 ENCOUNTER — Encounter (HOSPITAL_COMMUNITY): Payer: Self-pay | Admitting: Obstetrics and Gynecology

## 2022-04-07 ENCOUNTER — Other Ambulatory Visit: Payer: Self-pay

## 2022-04-07 ENCOUNTER — Inpatient Hospital Stay (HOSPITAL_COMMUNITY)
Admission: RE | Admit: 2022-04-07 | Discharge: 2022-04-09 | DRG: 742 | Disposition: A | Discharge status: Home / Self Care | Payer: 59 | Attending: Obstetrics and Gynecology | Admitting: Obstetrics and Gynecology

## 2022-04-07 DIAGNOSIS — Z90710 Acquired absence of both cervix and uterus: Secondary | ICD-10-CM | POA: Diagnosis present

## 2022-04-07 DIAGNOSIS — D251 Intramural leiomyoma of uterus: Secondary | ICD-10-CM | POA: Diagnosis present

## 2022-04-07 DIAGNOSIS — Z01818 Encounter for other preprocedural examination: Secondary | ICD-10-CM

## 2022-04-07 DIAGNOSIS — Z809 Family history of malignant neoplasm, unspecified: Secondary | ICD-10-CM

## 2022-04-07 DIAGNOSIS — D252 Subserosal leiomyoma of uterus: Principal | ICD-10-CM | POA: Diagnosis present

## 2022-04-07 DIAGNOSIS — Z6841 Body Mass Index (BMI) 40.0 and over, adult: Secondary | ICD-10-CM | POA: Diagnosis not present

## 2022-04-07 DIAGNOSIS — D219 Benign neoplasm of connective and other soft tissue, unspecified: Principal | ICD-10-CM

## 2022-04-07 DIAGNOSIS — E669 Obesity, unspecified: Secondary | ICD-10-CM | POA: Diagnosis present

## 2022-04-07 DIAGNOSIS — N83202 Unspecified ovarian cyst, left side: Secondary | ICD-10-CM | POA: Diagnosis present

## 2022-04-07 DIAGNOSIS — N939 Abnormal uterine and vaginal bleeding, unspecified: Secondary | ICD-10-CM

## 2022-04-07 DIAGNOSIS — D259 Leiomyoma of uterus, unspecified: Secondary | ICD-10-CM

## 2022-04-07 DIAGNOSIS — I10 Essential (primary) hypertension: Secondary | ICD-10-CM | POA: Diagnosis not present

## 2022-04-07 DIAGNOSIS — Z833 Family history of diabetes mellitus: Secondary | ICD-10-CM

## 2022-04-07 HISTORY — PX: HYSTERECTOMY ABDOMINAL WITH SALPINGECTOMY: SHX6725

## 2022-04-07 LAB — CBC
HCT: 35.3 % — ABNORMAL LOW (ref 36.0–46.0)
Hemoglobin: 12.1 g/dL (ref 12.0–15.0)
MCH: 29.9 pg (ref 26.0–34.0)
MCHC: 34.3 g/dL (ref 30.0–36.0)
MCV: 87.2 fL (ref 80.0–100.0)
Platelets: 346 10*3/uL (ref 150–400)
RBC: 4.05 MIL/uL (ref 3.87–5.11)
RDW: 13.2 % (ref 11.5–15.5)
WBC: 9.9 10*3/uL (ref 4.0–10.5)
nRBC: 0 % (ref 0.0–0.2)

## 2022-04-07 LAB — TYPE AND SCREEN
ABO/RH(D): O POS
Antibody Screen: POSITIVE

## 2022-04-07 LAB — POCT PREGNANCY, URINE: Preg Test, Ur: NEGATIVE

## 2022-04-07 SURGERY — HYSTERECTOMY, TOTAL, ABDOMINAL, WITH SALPINGECTOMY
Anesthesia: General | Site: Abdomen | Laterality: Bilateral

## 2022-04-07 MED ORDER — CHLORHEXIDINE GLUCONATE 0.12 % MT SOLN
15.0000 mL | Freq: Once | OROMUCOSAL | Status: AC
  Administered 2022-04-07: 15 mL via OROMUCOSAL
  Filled 2022-04-07: qty 15

## 2022-04-07 MED ORDER — ACETAMINOPHEN 500 MG PO TABS: 1000.0000 mg | ORAL_TABLET | ORAL | Status: AC

## 2022-04-07 MED ORDER — GABAPENTIN 100 MG PO CAPS
200.0000 mg | ORAL_CAPSULE | Freq: Two times a day (BID) | ORAL | Status: DC
  Administered 2022-04-07 – 2022-04-08 (×3): 200 mg via ORAL
  Filled 2022-04-07 (×3): qty 2

## 2022-04-07 MED ORDER — ONDANSETRON HCL 4 MG/2ML IJ SOLN: 4.0000 mg | Freq: Four times a day (QID) | INTRAMUSCULAR | Status: DC | PRN

## 2022-04-07 MED ORDER — BUPIVACAINE HCL 0.5 % IJ SOLN
INTRAMUSCULAR | Status: DC | PRN
  Administered 2022-04-07: 30 mL

## 2022-04-07 MED ORDER — PHENYLEPHRINE 80 MCG/ML (10ML) SYRINGE FOR IV PUSH (FOR BLOOD PRESSURE SUPPORT)
PREFILLED_SYRINGE | INTRAVENOUS | Status: AC
  Filled 2022-04-07: qty 10

## 2022-04-07 MED ORDER — MORPHINE SULFATE 1 MG/ML IV SOLN PCA
INTRAVENOUS | Status: DC
  Administered 2022-04-07: 1.5 mg via INTRAVENOUS
  Filled 2022-04-07: qty 30

## 2022-04-07 MED ORDER — FENTANYL CITRATE (PF) 250 MCG/5ML IJ SOLN
INTRAMUSCULAR | Status: AC
  Filled 2022-04-07: qty 5

## 2022-04-07 MED ORDER — PHENYLEPHRINE HCL (PRESSORS) 10 MG/ML IV SOLN
INTRAVENOUS | Status: DC | PRN
  Administered 2022-04-07 (×2): 80 ug via INTRAVENOUS

## 2022-04-07 MED ORDER — DEXAMETHASONE SODIUM PHOSPHATE 10 MG/ML IJ SOLN
INTRAMUSCULAR | Status: AC
  Filled 2022-04-07: qty 1

## 2022-04-07 MED ORDER — IBUPROFEN 600 MG PO TABS
600.0000 mg | ORAL_TABLET | Freq: Four times a day (QID) | ORAL | Status: DC
  Administered 2022-04-07 – 2022-04-09 (×7): 600 mg via ORAL
  Filled 2022-04-07 (×7): qty 1

## 2022-04-07 MED ORDER — ONDANSETRON HCL 4 MG/2ML IJ SOLN
INTRAMUSCULAR | Status: AC
  Filled 2022-04-07: qty 2

## 2022-04-07 MED ORDER — ACETAMINOPHEN 325 MG PO TABS: 650.0000 mg | ORAL_TABLET | ORAL | Status: DC | PRN

## 2022-04-07 MED ORDER — SUGAMMADEX SODIUM 200 MG/2ML IV SOLN
INTRAVENOUS | Status: DC | PRN
  Administered 2022-04-07: 261.2 mg via INTRAVENOUS

## 2022-04-07 MED ORDER — FENTANYL CITRATE (PF) 100 MCG/2ML IJ SOLN
INTRAMUSCULAR | Status: AC
  Administered 2022-04-07: 100 ug via INTRAVENOUS
  Filled 2022-04-07: qty 2

## 2022-04-07 MED ORDER — BUPIVACAINE-EPINEPHRINE (PF) 0.5% -1:200000 IJ SOLN
INTRAMUSCULAR | Status: DC | PRN
  Administered 2022-04-07: 15 mL via PERINEURAL

## 2022-04-07 MED ORDER — CEFAZOLIN IN SODIUM CHLORIDE 3-0.9 GM/100ML-% IV SOLN
INTRAVENOUS | Status: AC
  Filled 2022-04-07: qty 100

## 2022-04-07 MED ORDER — GABAPENTIN 300 MG PO CAPS
ORAL_CAPSULE | ORAL | Status: AC
  Administered 2022-04-07: 300 mg via ORAL
  Filled 2022-04-07: qty 1

## 2022-04-07 MED ORDER — KETOROLAC TROMETHAMINE 30 MG/ML IJ SOLN
INTRAMUSCULAR | Status: AC
  Filled 2022-04-07: qty 1

## 2022-04-07 MED ORDER — OXYCODONE HCL 5 MG PO TABS: 5.0000 mg | ORAL_TABLET | ORAL | Status: DC | PRN

## 2022-04-07 MED ORDER — PROPOFOL 10 MG/ML IV BOLUS
INTRAVENOUS | Status: DC | PRN
  Administered 2022-04-07: 150 mg via INTRAVENOUS

## 2022-04-07 MED ORDER — PROPOFOL 10 MG/ML IV BOLUS
INTRAVENOUS | Status: AC
  Filled 2022-04-07: qty 20

## 2022-04-07 MED ORDER — 0.9 % SODIUM CHLORIDE (POUR BTL) OPTIME
TOPICAL | Status: DC | PRN
  Administered 2022-04-07: 1000 mL

## 2022-04-07 MED ORDER — FENTANYL CITRATE (PF) 100 MCG/2ML IJ SOLN
INTRAMUSCULAR | Status: AC
  Filled 2022-04-07: qty 2

## 2022-04-07 MED ORDER — SIMETHICONE 80 MG PO CHEW: 80.0000 mg | CHEWABLE_TABLET | Freq: Four times a day (QID) | ORAL | Status: DC | PRN

## 2022-04-07 MED ORDER — ROCURONIUM BROMIDE 10 MG/ML (PF) SYRINGE
PREFILLED_SYRINGE | INTRAVENOUS | Status: AC
  Filled 2022-04-07: qty 10

## 2022-04-07 MED ORDER — ONDANSETRON HCL 4 MG PO TABS: 4.0000 mg | ORAL_TABLET | Freq: Four times a day (QID) | ORAL | Status: DC | PRN

## 2022-04-07 MED ORDER — SODIUM CHLORIDE 0.9% FLUSH: 9.0000 mL | INTRAVENOUS | Status: DC | PRN

## 2022-04-07 MED ORDER — ORAL CARE MOUTH RINSE: 15.0000 mL | Freq: Once | OROMUCOSAL | Status: AC

## 2022-04-07 MED ORDER — BUPIVACAINE HCL (PF) 0.5 % IJ SOLN
INTRAMUSCULAR | Status: AC
  Filled 2022-04-07: qty 30

## 2022-04-07 MED ORDER — MORPHINE SULFATE 1 MG/ML IV SOLN PCA
INTRAVENOUS | Status: AC
  Filled 2022-04-07: qty 30

## 2022-04-07 MED ORDER — MAGNESIUM HYDROXIDE 400 MG/5ML PO SUSP: 30.0000 mL | Freq: Every day | ORAL | Status: DC | PRN

## 2022-04-07 MED ORDER — FENTANYL CITRATE (PF) 250 MCG/5ML IJ SOLN
INTRAMUSCULAR | Status: DC | PRN
  Administered 2022-04-07: 100 ug via INTRAVENOUS
  Administered 2022-04-07 (×2): 50 ug via INTRAVENOUS

## 2022-04-07 MED ORDER — DOCUSATE SODIUM 100 MG PO CAPS
100.0000 mg | ORAL_CAPSULE | Freq: Two times a day (BID) | ORAL | Status: DC
  Administered 2022-04-07 – 2022-04-08 (×3): 100 mg via ORAL
  Filled 2022-04-07 (×3): qty 1

## 2022-04-07 MED ORDER — ENOXAPARIN SODIUM 40 MG/0.4ML IJ SOSY
40.0000 mg | PREFILLED_SYRINGE | INTRAMUSCULAR | Status: DC
  Administered 2022-04-08: 40 mg via SUBCUTANEOUS
  Filled 2022-04-07: qty 0.4

## 2022-04-07 MED ORDER — MIDAZOLAM HCL 2 MG/2ML IJ SOLN
INTRAMUSCULAR | Status: AC
  Administered 2022-04-07: 2 mg via INTRAVENOUS
  Filled 2022-04-07: qty 2

## 2022-04-07 MED ORDER — FENTANYL CITRATE (PF) 100 MCG/2ML IJ SOLN: 100.0000 ug | Freq: Once | INTRAMUSCULAR | Status: AC

## 2022-04-07 MED ORDER — LIDOCAINE 2% (20 MG/ML) 5 ML SYRINGE
INTRAMUSCULAR | Status: AC
  Filled 2022-04-07: qty 5

## 2022-04-07 MED ORDER — ONDANSETRON HCL 4 MG/2ML IJ SOLN
INTRAMUSCULAR | Status: DC | PRN
  Administered 2022-04-07: 4 mg via INTRAVENOUS

## 2022-04-07 MED ORDER — MIDAZOLAM HCL 2 MG/2ML IJ SOLN
INTRAMUSCULAR | Status: AC
  Filled 2022-04-07: qty 2

## 2022-04-07 MED ORDER — FENTANYL CITRATE (PF) 100 MCG/2ML IJ SOLN
25.0000 ug | INTRAMUSCULAR | Status: DC | PRN
  Administered 2022-04-07 (×3): 50 ug via INTRAVENOUS

## 2022-04-07 MED ORDER — BUPIVACAINE-EPINEPHRINE (PF) 0.5% -1:200000 IJ SOLN
INTRAMUSCULAR | Status: DC | PRN
  Administered 2022-04-07: 15 mL

## 2022-04-07 MED ORDER — ACETAMINOPHEN 500 MG PO TABS
ORAL_TABLET | ORAL | Status: AC
  Administered 2022-04-07: 1000 mg via ORAL
  Filled 2022-04-07: qty 2

## 2022-04-07 MED ORDER — SOD CITRATE-CITRIC ACID 500-334 MG/5ML PO SOLN
ORAL | Status: AC
  Administered 2022-04-07: 30 mL via ORAL
  Filled 2022-04-07: qty 30

## 2022-04-07 MED ORDER — DIPHENHYDRAMINE HCL 12.5 MG/5ML PO ELIX: 12.5000 mg | ORAL_SOLUTION | Freq: Four times a day (QID) | ORAL | Status: DC | PRN

## 2022-04-07 MED ORDER — SUGAMMADEX SODIUM 500 MG/5ML IV SOLN
INTRAVENOUS | Status: AC
  Filled 2022-04-07: qty 5

## 2022-04-07 MED ORDER — DIPHENHYDRAMINE HCL 50 MG/ML IJ SOLN: 12.5000 mg | Freq: Four times a day (QID) | INTRAMUSCULAR | Status: DC | PRN

## 2022-04-07 MED ORDER — LACTATED RINGERS IV SOLN: INTRAVENOUS | Status: DC

## 2022-04-07 MED ORDER — MIDAZOLAM HCL 2 MG/2ML IJ SOLN: 2.0000 mg | Freq: Once | INTRAMUSCULAR | Status: AC

## 2022-04-07 MED ORDER — SOD CITRATE-CITRIC ACID 500-334 MG/5ML PO SOLN: 30.0000 mL | ORAL | Status: AC

## 2022-04-07 MED ORDER — ROCURONIUM BROMIDE 10 MG/ML (PF) SYRINGE
PREFILLED_SYRINGE | INTRAVENOUS | Status: DC | PRN
  Administered 2022-04-07: 50 mg via INTRAVENOUS

## 2022-04-07 MED ORDER — POVIDONE-IODINE 10 % EX SWAB
2.0000 | Freq: Once | CUTANEOUS | Status: AC
  Administered 2022-04-07: 2 via TOPICAL

## 2022-04-07 MED ORDER — CEFAZOLIN IN SODIUM CHLORIDE 3-0.9 GM/100ML-% IV SOLN
3.0000 g | INTRAVENOUS | Status: AC
  Administered 2022-04-07: 3 g via INTRAVENOUS

## 2022-04-07 MED ORDER — BUPIVACAINE-EPINEPHRINE (PF) 0.5% -1:200000 IJ SOLN: INTRAMUSCULAR | Status: DC | PRN

## 2022-04-07 MED ORDER — NALOXONE HCL 0.4 MG/ML IJ SOLN: 0.4000 mg | INTRAMUSCULAR | Status: DC | PRN

## 2022-04-07 MED ORDER — KETOROLAC TROMETHAMINE 15 MG/ML IJ SOLN
INTRAMUSCULAR | Status: DC | PRN
  Administered 2022-04-07: 15 mg via INTRAVENOUS

## 2022-04-07 MED ORDER — DEXAMETHASONE SODIUM PHOSPHATE 10 MG/ML IJ SOLN
INTRAMUSCULAR | Status: DC | PRN
  Administered 2022-04-07: 10 mg via INTRAVENOUS

## 2022-04-07 MED ORDER — GABAPENTIN 300 MG PO CAPS: 300.0000 mg | ORAL_CAPSULE | ORAL | Status: AC

## 2022-04-07 MED ORDER — KETOROLAC TROMETHAMINE 30 MG/ML IJ SOLN
30.0000 mg | Freq: Once | INTRAMUSCULAR | Status: AC
  Administered 2022-04-07: 30 mg via INTRAVENOUS

## 2022-04-07 SURGICAL SUPPLY — 31 items
BAG COUNTER SPONGE SURGICOUNT (BAG) ×1 IMPLANT
BENZOIN TINCTURE PRP APPL 2/3 (GAUZE/BANDAGES/DRESSINGS) IMPLANT
DRAPE CESAREAN BIRTH W POUCH (DRAPES) IMPLANT
DRSG OPSITE POSTOP 4X10 (GAUZE/BANDAGES/DRESSINGS) ×1 IMPLANT
DURAPREP 26ML APPLICATOR (WOUND CARE) ×1 IMPLANT
GAUZE 4X4 16PLY ~~LOC~~+RFID DBL (SPONGE) ×1 IMPLANT
GLOVE BIO SURGEON STRL SZ 6.5 (GLOVE) ×1 IMPLANT
GLOVE SURG UNDER POLY LF SZ7 (GLOVE) ×3 IMPLANT
GOWN STRL REUS W/ TWL LRG LVL3 (GOWN DISPOSABLE) ×3 IMPLANT
GOWN STRL REUS W/TWL LRG LVL3 (GOWN DISPOSABLE) ×2
HIBICLENS CHG 4% 4OZ BTL (MISCELLANEOUS) ×1 IMPLANT
KIT TURNOVER KIT B (KITS) ×1 IMPLANT
NEEDLE HYPO 22GX1.5 SAFETY (NEEDLE) ×1 IMPLANT
PACK ABDOMINAL GYN (CUSTOM PROCEDURE TRAY) ×1 IMPLANT
PAD ARMBOARD 7.5X6 YLW CONV (MISCELLANEOUS) ×1 IMPLANT
PAD OB MATERNITY 4.3X12.25 (PERSONAL CARE ITEMS) ×1 IMPLANT
PENCIL SMOKE EVAC W/HOLSTER (ELECTROSURGICAL) ×1 IMPLANT
SPONGE T-LAP 18X18 ~~LOC~~+RFID (SPONGE) ×1 IMPLANT
STRIP CLOSURE SKIN 1/4X4 (GAUZE/BANDAGES/DRESSINGS) IMPLANT
SUT PLAIN 2 0 XLH (SUTURE) ×1 IMPLANT
SUT VIC AB 0 CT1 18XCR BRD8 (SUTURE) ×4 IMPLANT
SUT VIC AB 0 CT1 27 (SUTURE) ×1
SUT VIC AB 0 CT1 27XBRD ANBCTR (SUTURE) ×1 IMPLANT
SUT VIC AB 0 CT1 8-18 (SUTURE) ×2
SUT VIC AB 2-0 CT1 27 (SUTURE) ×1
SUT VIC AB 2-0 CT1 TAPERPNT 27 (SUTURE) ×1 IMPLANT
SUT VIC AB 4-0 PS2 27 (SUTURE) ×1 IMPLANT
SUT VICRYL 0 TIES 12 18 (SUTURE) ×1 IMPLANT
SYR CONTROL 10ML LL (SYRINGE) ×1 IMPLANT
TOWEL GREEN STERILE FF (TOWEL DISPOSABLE) ×2 IMPLANT
TRAY FOLEY W/BAG SLVR 14FR (SET/KITS/TRAYS/PACK) ×1 IMPLANT

## 2022-04-07 NOTE — Anesthesia Procedure Notes (Addendum)
Anesthesia Regional Block: TAP block   Pre-Anesthetic Checklist: , timeout performed,  Correct Patient, Correct Site, Correct Laterality,  Correct Procedure, Correct Position, site marked,  Risks and benefits discussed,  Surgical consent,  Pre-op evaluation,  At surgeon's request and post-op pain management  Laterality: Left  Prep: chloraprep       Needles:  Injection technique: Single-shot  Needle Type: Echogenic Stimulator Needle          Additional Needles:   Procedures: Doppler guided,,,, ultrasound used (permanent image in chart),,    Narrative:  Start time: 04/07/2022 10:10 AM End time: 04/07/2022 10:30 AM Injection made incrementally with aspirations every 5 mL.  Performed by: Personally  Anesthesiologist: Belinda Block, MD

## 2022-04-07 NOTE — Anesthesia Preprocedure Evaluation (Signed)
Anesthesia Evaluation  Patient identified by MRN, date of birth, ID band Patient awake    Reviewed: Allergy & Precautions, NPO status , Patient's Chart, lab work & pertinent test results  Airway Mallampati: II       Dental   Pulmonary neg pulmonary ROS   breath sounds clear to auscultation       Cardiovascular hypertension,  Rhythm:Regular Rate:Normal     Neuro/Psych    GI/Hepatic negative GI ROS, Neg liver ROS,,,  Endo/Other  negative endocrine ROS    Renal/GU negative Renal ROS     Musculoskeletal   Abdominal   Peds  Hematology   Anesthesia Other Findings   Reproductive/Obstetrics                             Anesthesia Physical Anesthesia Plan  ASA: 3  Anesthesia Plan: General   Post-op Pain Management: Regional block*   Induction: Intravenous  PONV Risk Score and Plan: Ondansetron, Dexamethasone and Midazolam  Airway Management Planned: Oral ETT  Additional Equipment:   Intra-op Plan:   Post-operative Plan: Extubation in OR  Informed Consent: I have reviewed the patients History and Physical, chart, labs and discussed the procedure including the risks, benefits and alternatives for the proposed anesthesia with the patient or authorized representative who has indicated his/her understanding and acceptance.     Dental advisory given  Plan Discussed with: CRNA and Anesthesiologist  Anesthesia Plan Comments:        Anesthesia Quick Evaluation

## 2022-04-07 NOTE — Anesthesia Postprocedure Evaluation (Signed)
Anesthesia Post Note  Patient: Katherine Arellano  Procedure(s) Performed: HYSTERECTOMY ABDOMINAL WITH BILATERAL SALPINGECTOMY (Bilateral: Abdomen)     Patient location during evaluation: PACU Anesthesia Type: General Level of consciousness: awake Pain management: pain level controlled Vital Signs Assessment: post-procedure vital signs reviewed and stable Respiratory status: spontaneous breathing Cardiovascular status: stable Postop Assessment: no apparent nausea or vomiting Anesthetic complications: no   No notable events documented.  Last Vitals:  Vitals:   04/07/22 1345 04/07/22 1400  BP: (!) 152/75 (!) 147/88  Pulse: 73 74  Resp: 19 12  Temp:  36.5 C  SpO2: 100% 100%    Last Pain:  Vitals:   04/07/22 1400  PainSc: 2                  Marcial Pless

## 2022-04-07 NOTE — Addendum Note (Signed)
Addendum  created 04/07/22 1505 by Belinda Block, MD   Child order released for a procedure order, Clinical Note Signed, Intraprocedure Blocks edited, Intraprocedure Meds edited, Order Canceled from Note, SmartForm saved

## 2022-04-07 NOTE — Anesthesia Procedure Notes (Signed)
Procedure Name: Intubation Date/Time: 04/07/2022 10:40 AM  Performed by: Maude Leriche, CRNAPre-anesthesia Checklist: Patient identified, Emergency Drugs available, Suction available and Patient being monitored Patient Re-evaluated:Patient Re-evaluated prior to induction Oxygen Delivery Method: Circle system utilized Preoxygenation: Pre-oxygenation with 100% oxygen Induction Type: IV induction Ventilation: Mask ventilation without difficulty Laryngoscope Size: Miller and 2 Grade View: Grade II Tube type: Oral Tube size: 7.0 mm Number of attempts: 1 Airway Equipment and Method: Stylet Placement Confirmation: ETT inserted through vocal cords under direct vision, positive ETCO2 and breath sounds checked- equal and bilateral Secured at: 21 cm Tube secured with: Tape Dental Injury: Teeth and Oropharynx as per pre-operative assessment

## 2022-04-07 NOTE — Op Note (Signed)
[facility] PROCEDURE DATE: 04/07/2022  PREOPERATIVE DIAGNOSES:  Symptomatic fibroids, abnormal uterine bleeding POSTOPERATIVE DIAGNOSES:  The same SURGEON:  Dr. Lynnda Shields ASSISTANT:  Dr. Arlina Robes.    An experienced assistant was required given the standard of surgical care given the complexity of the case.  This assistant was needed for exposure, dissection, suctioning, retraction, instrument exchange, and for overall help during the procedure.  OPERATION:  Total abdominal hysterectomy, Bilateral Salpingectomy y, Bilateral Salpingoohorectomy ANESTHESIA:  General endotracheal.  INDICATIONS: The patient is a 46 y.o. G0P0000 with the aforementioned diagnoses who desires definitive surgical management. On the preoperative visit, the risks, benefits, indications, and alternatives of the procedure were reviewed with the patient.  On the day of surgery, the risks of surgery were again discussed with the patient including but not limited to: bleeding which may require transfusion or reoperation; infection which may require antibiotics; injury to bowel, bladder, ureters or other surrounding organs; need for additional procedures; thromboembolic phenomenon, incisional problems and other postoperative/anesthesia complications. Written informed consent was obtained.    OPERATIVE FINDINGS: A 17 week size uterus with normal tubes and ovaries bilaterally.  ESTIMATED BLOOD LOSS: 200 ml FLUIDS:  1300 ml of Lactated Ringers URINE OUTPUT:  300 ml of clear yellow urine. SPECIMENS:  Uterus,cervix,  bilateral fallopian tubes  sent to pathology COMPLICATIONS:  None immediate.   DESCRIPTION OF PROCEDURE:  The patient received intravenous antibiotics and had sequential compression devices applied to her lower extremities while in the preoperative area.   She was taken to the operating room and placed under general anesthesia without difficulty.The abdomen and perineum were prepped and draped in a sterile  manner, and she was placed in a dorsal supine position.  A Foley catheter was inserted into the bladder and attached to constant drainage. After an adequate timeout was performed, a Pfannensteil skin incision was made. This incision was taken down to the fascia using electrocautery with care given to maintain good hemostasis. The fascia was incised in the midline and the fascial incision was then extended bilaterally using electrocautery without difficulty. The fascia was then dissected off the underlying rectus muscles using blunt and sharp dissection. The rectus muscles were split bluntly in the midline and the peritoneum entered sharply without complication. This peritoneal incision was then extended superiorly and inferiorly with care given to prevent bowel or bladder injury. Attention was then turned to the pelvis. A retractor was placed into the incision, and the bowel was packed away with moist laparotomy sponges. The uterus at this point was noted to be mobilized and was delivered up out of the abdomen.  The round ligaments on each side were clamped, suture ligated with 0 Vicryl, and transected with electrocautery allowing entry into the broad ligament. Of note, all sutures used in this procedure are 0 Vicryl unless otherwise noted. The anterior and posterior leaves of the broad ligament were separated, and the ureters were inspected to be safely away from the area of dissection bilaterally.  Adnexae were clamped on the patient's right side, cut, and doubly suture ligated. This procedure was repeated in an identical fashion on the left site allowing for both adnexa to remain in place.  Kelly clamps were placed on the mesosalpinx of the right fallopian tube, and the fallopian tube was excised.  The pedicle was then secured with a free tie.  A similar process was carried out on the left side, allowing for bilateral salpingectomy.     A bladder flap was then created.  The bladder was then bluntly dissected off  the lower uterine segment and cervix with good hemostasis noted. The uterine arteries were then skeletonized bilaterally and then clamped, cut, and doubly suture ligated with care given to prevent ureteral injury.  he uterosacral ligaments were then clamped, cut, and ligated bilaterally.  Finally, the cardinal ligaments were clamped, cut, and ligated bilaterally.  Acutely curved clamps were placed across the vagina just under the cervix, and the specimen was amputated and sent to pathology. The vaginal cuff angles were closed with Heaney stiches with care given to incorporate the uterosacral-cardinal ligament pedicles on both sides. The middle of the vaginal cuff was closed with a series of interrupted figure-of-eight sutures with care given to incorporate the anterior pubocervical fascia and the posterior rectovaginal fascia.   The pelvis was irrigated and hemostasis was reconfirmed at all pedicles and along the pelvic sidewall.  The ureters were inspected and noted to be peristalsing bilaterally.  All laparotomy sponges and instruments were removed from the abdomen. The peritoneum was closed with a running stitch, and the fascia was also closed in a running fashion. The subcutaneous layer was reapproximated with 2-0 plain gut. The skin was closed with a 4-0 Vicryl subcuticular stitch. Sponge, lap, needle, and instrument counts were correct times two. The patient was taken to the recovery area awake, extubated and in stable condition.   Lynnda Shields, MD, [place] for [facility], [place]

## 2022-04-07 NOTE — Interval H&P Note (Signed)
History and Physical Interval Note:  04/07/2022 9:47 AM  Katherine Arellano  has presented today for surgery, with the diagnosis of Fibroids.  The various methods of treatment have been discussed with the patient and family. After consideration of risks, benefits and other options for treatment, the patient has consented to  Procedure(s) with comments: HYSTERECTOMY ABDOMINAL WITH SALPINGECTOMY (Bilateral) - TAP BLOCK as a surgical intervention.  The patient's history has been reviewed, patient examined, no change in status, stable for surgery.  I have reviewed the patient's chart and labs.  Questions were answered to the patient's satisfaction.     [name]

## 2022-04-07 NOTE — Transfer of Care (Signed)
Immediate Anesthesia Transfer of Care Note  Patient: [place] WELCOME  Procedure(s) Performed: HYSTERECTOMY ABDOMINAL WITH BILATERAL SALPINGECTOMY (Bilateral: Abdomen)  Patient Location: PACU  Anesthesia Type:GA combined with regional for post-op pain  Level of Consciousness: awake, alert , and oriented  Airway & Oxygen Therapy: Patient Spontanous Breathing and Patient connected to nasal cannula oxygen  Post-op Assessment: Report given to RN, Post -op Vital signs reviewed and stable, Patient moving all extremities X 4, and Patient able to stick tongue midline  Post vital signs: Reviewed  Last Vitals:  Vitals Value Taken Time  BP 142/75 04/07/22 1210  Temp 98.6   Pulse 70 04/07/22 1213  Resp 15 04/07/22 1213  SpO2 96 % 04/07/22 1213  Vitals shown include unvalidated device data.  Last Pain:  Vitals:   04/07/22 0814  PainSc: 0-No pain         Complications: No notable events documented.

## 2022-04-08 ENCOUNTER — Encounter (HOSPITAL_COMMUNITY): Payer: Self-pay | Admitting: Obstetrics and Gynecology

## 2022-04-08 LAB — SURGICAL PATHOLOGY

## 2022-04-08 LAB — BASIC METABOLIC PANEL
Anion gap: 10 (ref 5–15)
BUN: 6 mg/dL (ref 6–20)
CO2: 21 mmol/L — ABNORMAL LOW (ref 22–32)
Calcium: 8.4 mg/dL — ABNORMAL LOW (ref 8.9–10.3)
Chloride: 103 mmol/L (ref 98–111)
Creatinine, Ser: 0.75 mg/dL (ref 0.44–1.00)
GFR, Estimated: >60 mL/min (ref >60)
Glucose, Bld: 122 mg/dL — ABNORMAL HIGH (ref 70–99)
Potassium: 3.9 mmol/L (ref 3.5–5.1)
Sodium: 134 mmol/L — ABNORMAL LOW (ref 135–145)

## 2022-04-08 LAB — CBC
HCT: 33.3 % — ABNORMAL LOW (ref 36.0–46.0)
Hemoglobin: 10.9 g/dL — ABNORMAL LOW (ref 12.0–15.0)
MCH: 29.1 pg (ref 26.0–34.0)
MCHC: 32.7 g/dL (ref 30.0–36.0)
MCV: 89 fL (ref 80.0–100.0)
Platelets: 329 10*3/uL (ref 150–400)
RBC: 3.74 MIL/uL — ABNORMAL LOW (ref 3.87–5.11)
RDW: 12.9 % (ref 11.5–15.5)
WBC: 8.7 10*3/uL (ref 4.0–10.5)
nRBC: 0 % (ref 0.0–0.2)

## 2022-04-08 NOTE — Progress Notes (Signed)
Mobility Specialist - Progress Note   04/08/22 1634  Mobility  Activity Ambulated independently in hallway  Level of Assistance Independent  Assistive Device None  Distance Ambulated (ft) 1100 ft  Activity Response Tolerated well  $Mobility charge 1 Mobility    Pt received in recliner eager to ambulate in hallway. No complaints throughout, no physical assistance needed. Left in recliner w/ call bell in reach and all needs met.   Paulla Dolly Mobility Specialist

## 2022-04-08 NOTE — Progress Notes (Signed)
Mobility Specialist - Progress Note   04/08/22 1535  Mobility  Activity Ambulated independently in hallway  Level of Assistance Independent  Assistive Device None  Distance Ambulated (ft) 1100 ft  Activity Response Tolerated well  $Mobility charge 1 Mobility    Pt ambulated independently in hallway. No physical assistance needed.    Paulla Dolly Mobility Specialist

## 2022-04-08 NOTE — Progress Notes (Signed)
Gynecology Progress Note  Admission Date: 04/07/2022 Current Date: 04/08/2022 10:10 AM  Katherine Arellano is a 46 y.o. G0P0000 POD#1 admitted for TAH with bilateral salpingectomy   History complicated by: Patient Active Problem List   Diagnosis Date Noted   S/P abdominal hysterectomy 04/07/2022   Intramural and subserous leiomyoma of uterus    Menorrhagia with regular cycle 03/13/2022   Fibroids 01/01/2022   Morbid obesity ([place]) 09/04/2019   Iron deficiency anemia due to chronic blood loss 11/01/2015   Benign essential hypertension 02/23/2014    ROS and patient/family/surgical history, located on admission H&P note dated 04/07/2022, have been reviewed, and there are no changes except as noted below Yesterday/Overnight Events:  none  Subjective:  Pt seen.  Doing well.  Pain well controlled with PCA.  Tolerated clears.  No acute complaints.  No flatus.  Objective:   Vitals:   04/08/22 0012 04/08/22 0456 04/08/22 0544 04/08/22 0748  BP:   (!) 140/99 135/88  Pulse:   87 78  Resp: '18 18  16  '$ Temp:   98.2 F (36.8 C) 98.3 F (36.8 C)  TempSrc:   Oral Oral  SpO2: 96% 98% 92% 100%  Weight:      Height:        Temp:  [97.7 F (36.5 C)-98.7 F (37.1 C)] 98.3 F (36.8 C) (10/11 0748) Pulse Rate:  [61-97] 78 (10/11 0748) Resp:  [12-21] 16 (10/11 0748) BP: (127-155)/(65-99) 135/88 (10/11 0748) SpO2:  [92 %-100 %] 100 % (10/11 0748) FiO2 (%):  [100 %] 100 % (10/11 0456) I/O last 3 completed shifts: In: 2180 [P.O.:480; I.V.:1600; IV Piggyback:100] Out: 1610 [Urine:3125; Blood:200] No intake/output data recorded.  Intake/Output Summary (Last 24 hours) at 04/08/2022 1010 Last data filed at 04/08/2022 0600 Gross per 24 hour  Intake 2180 ml  Output 3325 ml  Net -1145 ml     Current Vital Signs 24h Vital Sign Ranges  T 98.3 F (36.8 C) Temp  Avg: 98.1 F (36.7 C)  Min: 97.7 F (36.5 C)  Max: 98.7 F (37.1 C)  BP 135/88 BP  Min: 127/70  Max: 155/85  HR 78 Pulse  Avg: 75   Min: 61  Max: 97  RR 16 Resp  Avg: 15.7  Min: 12  Max: 21  SaO2 100 % Nasal Cannula SpO2  Avg: 98.3 %  Min: 92 %  Max: 100 %       24 Hour I/O Current Shift I/O  Time Ins Outs 10/10 0701 - 10/11 0700 In: 2180 [P.O.:480; I.V.:1600] Out: 9604 [Urine:3125] No intake/output data recorded.   Patient Vitals for the past 12 hrs:  BP Temp Temp src Pulse Resp SpO2  04/08/22 0748 135/88 98.3 F (36.8 C) Oral 78 16 100 %  04/08/22 0544 (!) 140/99 98.2 F (36.8 C) Oral 87 -- 92 %  04/08/22 0456 -- -- -- -- 18 98 %  04/08/22 0012 -- -- -- -- 18 96 %  04/08/22 0003 (!) 141/98 98.2 F (36.8 C) Oral 94 -- 96 %     Patient Vitals for the past 24 hrs:  BP Temp Temp src Pulse Resp SpO2  04/08/22 0748 135/88 98.3 F (36.8 C) Oral 78 16 100 %  04/08/22 0544 (!) 140/99 98.2 F (36.8 C) Oral 87 -- 92 %  04/08/22 0456 -- -- -- -- 18 98 %  04/08/22 0012 -- -- -- -- 18 96 %  04/08/22 0003 (!) 141/98 98.2 F (36.8 C) Oral 94 -- 96 %  04/07/22 2050 (!) 149/96 98.7 F (37.1 C) Oral 97 -- 100 %  04/07/22 2002 -- -- -- -- 18 98 %  04/07/22 1700 (!) 153/86 97.7 F (36.5 C) Oral 84 17 100 %  04/07/22 1642 (!) 155/85 98.1 F (36.7 C) -- 84 16 100 %  04/07/22 1600 (!) 143/83 -- -- 65 14 100 %  04/07/22 1530 (!) 151/90 -- -- 68 14 100 %  04/07/22 1500 (!) 145/83 -- -- 80 15 99 %  04/07/22 1430 (!) 149/92 -- -- 68 (!) 21 100 %  04/07/22 1400 (!) 147/88 97.7 F (36.5 C) -- 74 12 100 %  04/07/22 1345 (!) 152/75 -- -- 73 19 100 %  04/07/22 1330 (!) 155/85 -- -- 71 13 99 %  04/07/22 1322 -- -- -- -- 15 100 %  04/07/22 1315 (!) 150/76 -- -- 69 19 99 %  04/07/22 1300 (!) 148/76 -- -- 69 13 98 %  04/07/22 1245 (!) 152/86 -- -- 71 14 97 %  04/07/22 1230 (!) 141/65 -- -- 61 15 97 %  04/07/22 1215 (!) 143/77 -- -- 69 12 97 %  04/07/22 1211 (!) 142/75 98 F (36.7 C) -- 71 12 95 %  04/07/22 1025 -- -- -- 78 20 99 %  04/07/22 1020 133/75 -- -- 67 15 97 %  04/07/22 1015 127/70 -- -- 72 14 99 %    Physical  exam: General appearance: alert, cooperative, appears stated age, no distress, and moderately obese Abdomen: soft, non-tender; bowel sounds normal; no masses,  no organomegaly GU: No gross VB Lungs: clear to auscultation bilaterally Heart: regular rate and rhythm Extremities: no lower extremity edema, SCDs in place Skin: WNL, wound c/d/I, bandage not saturated Psych: appropriate Neurologic: Grossly normal  Medications Current Facility-Administered Medications  Medication Dose Route Frequency Provider Last Rate Last Admin   acetaminophen (TYLENOL) tablet 650 mg  650 mg Oral Q4H PRN [name], MD       docusate sodium (COLACE) capsule 100 mg  100 mg Oral BID [name], MD   100 mg at 04/08/22 1009   enoxaparin (LOVENOX) injection 40 mg  40 mg Subcutaneous Q24H [name], MD   40 mg at 04/08/22 1000   gabapentin (NEURONTIN) capsule 200 mg  200 mg Oral BID [name], MD   200 mg at 04/08/22 1009   ibuprofen (ADVIL) tablet 600 mg  600 mg Oral Q6H [name], MD   600 mg at 04/08/22 0500   lactated ringers infusion   Intravenous Continuous [name], MD 125 mL/hr at 04/07/22 1819 New Bag at 04/07/22 1819   magnesium hydroxide (MILK OF MAGNESIA) suspension 30 mL  30 mL Oral Daily PRN [name], MD       ondansetron [facility]) tablet 4 mg  4 mg Oral Q6H PRN [name], MD       Or   ondansetron [facility]) injection 4 mg  4 mg Intravenous Q6H PRN [name], MD       oxyCODONE (Oxy IR/ROXICODONE) immediate release tablet 5-10 mg  5-10 mg Oral Q4H PRN [name], MD       simethicone [facility]) chewable tablet 80 mg  80 mg Oral QID PRN [name], MD          Labs  Recent Labs  Lab 04/07/22 1805 04/08/22 0200  WBC 9.9 8.7  HGB 12.1 10.9*  HCT 35.3* 33.3*  PLT  346 329    Recent Labs  Lab 04/08/22 0200  NA 134*  K 3.9  CL 103  CO2 21*  BUN 6  CREATININE 0.75  CALCIUM 8.4*  GLUCOSE 122*     Radiology N/a  Assessment & Plan:  POD 1 s/p TAH *GYN: pt stable, discontinue PCA and remove foley catheter, encourage ambulation *Pain: currently well controlled, d/c PCA, start oral meds *FEN/GI: advance diet as tolerated, bowel sounds normal, awaiting flatus  Code Status: Full Code    Lynnda Shields, MD Attending [facility] for [place] [employer] [facility])

## 2022-04-08 NOTE — TOC Initial Note (Signed)
Transition of Care [facility]) - Initial/Assessment Note    Patient Details  Name: Katherine Arellano MRN: [id number] Date of Birth: March 28, 1976  [facility]) CM/SW Contact:    Ninfa Meeker, RN Phone Number: 04/08/2022, 11:39 AM  Clinical Narrative:   Transition of Care Screening Note:  S/p total abdominal hysterectomy   Transition of Care Department [facility]) has reviewed patient and no TOC needs have been identified at this time. We will continue to monitor patient advancement through Interdisciplinary progressions. If new patient transition needs arise, please place a consult.                        Patient Goals and CMS Choice        Expected Discharge Plan and Services                                                Prior Living Arrangements/Services                       Activities of Daily Living      Permission Sought/Granted                  Emotional Assessment              Admission diagnosis:  Fibroids [D21.9] S/P abdominal hysterectomy [Z90.710] Patient Active Problem List   Diagnosis Date Noted   S/P abdominal hysterectomy 04/07/2022   Intramural and subserous leiomyoma of uterus    Menorrhagia with regular cycle 03/13/2022   Fibroids 01/01/2022   Morbid obesity ([place]) 09/04/2019   Iron deficiency anemia due to chronic blood loss 11/01/2015   Benign essential hypertension 02/23/2014   PCP:  [name], [place] [facility]:   [facility] ([place], [place] [facility] [address] [place] [address] [place] [address] Phone: [phone] Fax: [phone]  [place], [place] - [address] [place] [address] Phone: [phone] Fax: [phone]     Social Determinants of Health (SDOH) Interventions    Readmission Risk Interventions     No data to display

## 2022-04-09 ENCOUNTER — Telehealth: Payer: Self-pay

## 2022-04-09 MED ORDER — OXYCODONE HCL 5 MG PO TABS: 5.0000 mg | ORAL_TABLET | ORAL | 0 refills | Status: DC | PRN

## 2022-04-09 MED ORDER — IBUPROFEN 600 MG PO TABS: 600.0000 mg | ORAL_TABLET | Freq: Four times a day (QID) | ORAL | 2 refills | Status: DC | PRN

## 2022-04-09 NOTE — Progress Notes (Signed)
Discharge Instructions given to pt. Pt verbalized understanding off all teaching and had no further questions.

## 2022-04-09 NOTE — Progress Notes (Signed)
Pt discharged to home with parents via wheelchair with all belongings by volunteer staff

## 2022-04-09 NOTE — Discharge Summary (Signed)
Physician Discharge Summary  Patient ID: Katherine Arellano MRN: [id number] DOB/AGE: 1976/04/30 46 y.o.  Admit date: 04/07/2022 Discharge date: 04/09/2022  Admission Diagnoses: symptomatic uterine fibroids  Discharge Diagnoses:  Principal Problem:   Fibroids Active Problems:   S/P abdominal hysterectomy   Intramural and subserous leiomyoma of uterus   Discharged Condition: good  Hospital Course: Pt admitted for total abdominal hysterectomy.  Please see separate operative note.  Pt received PCA the day of surgery.  It was discontinued the following morning along with her foley catheter.  Pt did well and her diet was advanced appropriately.  She noted positive flatus  and did well with oral pain medication. Overall the patient had an uncomplicated post operative course  The patient was discharged on POD 2 without incident.  Consults: None  Significant Diagnostic Studies: labs: cbc, BMP  Treatments: anticoagulation: LMW heparin  Discharge Exam: Blood pressure 135/88, pulse 78, temperature 98.3 F (36.8 C), temperature source Oral, resp. rate 16, height '5\' 6"'$  (1.676 m), weight 130.6 kg, SpO2 100 %. General appearance: alert, cooperative, appears stated age, no distress, and moderately obese Head: Normocephalic, without obvious abnormality, atraumatic Resp: clear to auscultation bilaterally Cardio: regular rate and rhythm GI: soft, non-tender; bowel sounds normal; no masses,  no organomegaly Extremities: extremities normal, atraumatic, no cyanosis or edema and Homans sign is negative, no sign of DVT Skin: Skin color, texture, turgor normal. No rashes or lesions Incision/Wound: bandage clean, dry and intact, no drainage on bandage  Disposition: Discharge disposition: 01-Home or Self Care       Discharge Instructions     Call MD for:  difficulty breathing, headache or visual disturbances   Complete by: As directed    Call MD for:  extreme fatigue   Complete by: As directed     Call MD for:  persistant dizziness or light-headedness   Complete by: As directed    Call MD for:  persistant nausea and vomiting   Complete by: As directed    Call MD for:  redness, tenderness, or signs of infection (pain, swelling, redness, odor or green/yellow discharge around incision site)   Complete by: As directed    Call MD for:  severe uncontrolled pain   Complete by: As directed    Call MD for:  temperature >100.4   Complete by: As directed    Diet - low sodium heart healthy   Complete by: As directed    Discharge wound care:   Complete by: As directed    Remove dressing on 04/12/22   Driving Restrictions   Complete by: As directed    No driving for 1-2 weeks or if still taking narcotic medication   Increase activity slowly   Complete by: As directed    Lifting restrictions   Complete by: As directed    No lifting more than 10-15 pounds for 4 weeks   Sexual Activity Restrictions   Complete by: As directed    Pelvic rest for 4-6 weeks      Allergies as of 04/09/2022   No Known Allergies      Medication List     TAKE these medications    amLODipine 10 MG tablet Commonly known as: NORVASC TAKE 1 TABLET BY MOUTH  DAILY   ibuprofen 600 MG tablet Commonly known as: ADVIL Take 1 tablet (600 mg total) by mouth every 6 (six) hours as needed for mild pain or moderate pain.   oxyCODONE 5 MG immediate release tablet Commonly known as: Oxy  IR/ROXICODONE Take 1-2 tablets (5-10 mg total) by mouth every 4 (four) hours as needed for moderate pain.               Discharge Care Instructions  (From admission, onward)           Start     Ordered   04/09/22 0000  Discharge wound care:       Comments: Remove dressing on 04/12/22   04/09/22 0914            Follow-up [place]. Schedule an appointment as soon as possible for a visit in 2 week(s).   Specialty: Obstetrics and Gynecology Why: wound check with  Ernestina Patches information: [address], [address] [place]. Schedule an appointment as soon as possible for a visit in 5 week(s).   Specialty: Obstetrics and Gynecology Why: post op appointment Contact information: [address], [place] [phone]                Signed: [name] 04/09/2022, 9:14 AM

## 2022-04-09 NOTE — Progress Notes (Signed)
Mobility Specialist - Progress Note   04/09/22 0959  Mobility  Activity Ambulated independently in hallway  Level of Assistance Independent  Assistive Device None  Distance Ambulated (ft) 550 ft  Activity Response Tolerated well  $Mobility charge 1 Mobility    Pt received in recliner agreeable to mobility. Left in room w/ call bell and all needs met.   Paulla Dolly Mobility Specialist

## 2022-04-10 LAB — TYPE AND SCREEN
ABO/RH(D): O POS
Antibody Screen: POSITIVE
Donor AG Type: NEGATIVE
Donor AG Type: NEGATIVE
PT AG Type: NEGATIVE
Unit division: 0
Unit division: 0

## 2022-04-10 LAB — BPAM RBC
Blood Product Expiration Date: 202310262359
Blood Product Expiration Date: 202310282359
Unit Type and Rh: 5100
Unit Type and Rh: 5100

## 2022-04-17 NOTE — Addendum Note (Signed)
Addendum  created 04/17/22 1917 by Belinda Block, MD   Clinical Note Signed, Intraprocedure Blocks edited

## 2022-04-17 NOTE — Addendum Note (Signed)
Addendum  created 04/17/22 1916 by Belinda Block, MD   Clinical Note Signed, Intraprocedure Blocks edited

## 2022-04-23 ENCOUNTER — Ambulatory Visit (INDEPENDENT_AMBULATORY_CARE_PROVIDER_SITE_OTHER): Payer: 59 | Admitting: Obstetrics and Gynecology

## 2022-04-23 VITALS — BP 134/85 | HR 75 | Wt 279.0 lb

## 2022-04-23 DIAGNOSIS — Z5189 Encounter for other specified aftercare: Secondary | ICD-10-CM

## 2022-04-23 NOTE — Progress Notes (Signed)
Pt is doing well post op, would like to know if she can use stairs.  Pt has no complaints- activity as tolerated and no bowel concerns.

## 2022-04-23 NOTE — Progress Notes (Signed)
  CC: post op wound check Subjective:    Patient ID: Katherine Arellano, female    DOB: [birth year], 47 y.o.   MRN: [id number]  HPI Pt seen approximately 2 weeks s/p TAH with bilateral salpingectomy.  She is doing well with no issues.  Questions answered regarding activity level.   Review of Systems     Objective:   Physical Exam Vitals:   04/23/22 1045  BP: 134/85  Pulse: 75   Wound:  steristrips removed, all were clean and unstained. Wound was clean dry and intact, mild induration just above and below incision line questionable for resolving hematoma or presence of suture.  No abnormal tenderness and pt no longer uses pain medication.      Assessment & Plan:   1. Visit for wound check Wound healing well Pt to have formal post op in 3 weeks    [name], MD Faculty Attending, [facility] for [facility]

## 2022-05-14 ENCOUNTER — Ambulatory Visit (INDEPENDENT_AMBULATORY_CARE_PROVIDER_SITE_OTHER): Payer: 59 | Admitting: Obstetrics and Gynecology

## 2022-05-14 ENCOUNTER — Encounter: Payer: Self-pay | Admitting: Obstetrics and Gynecology

## 2022-05-14 VITALS — BP 132/82 | HR 96 | Wt 282.3 lb

## 2022-05-14 DIAGNOSIS — Z90710 Acquired absence of both cervix and uterus: Secondary | ICD-10-CM

## 2022-05-14 DIAGNOSIS — Z4889 Encounter for other specified surgical aftercare: Secondary | ICD-10-CM

## 2022-05-14 NOTE — Progress Notes (Signed)
    Subjective:    Katherine Arellano is a 46 y.o. female who presents to the clinic status post abdominal hysterectomy on 04/09/22. The patient is not having any pain.  Eating a regular diet without difficulty. Bowel movements are normal. No other significant postoperative concerns.  The following portions of the patient's history were reviewed and updated as appropriate: allergies, current medications, past family history, past medical history, past social history, past surgical history, and problem list..  Last pap smear was normal on 11/07/21.  Review of Systems Pertinent items are noted in HPI.   Objective:   BP 132/82   Pulse 96   Wt 282 lb 4.8 oz (128.1 kg)   LMP 03/07/2022   BMI 45.56 kg/m  Constitutional:  Well-developed, well-nourished female in no acute distress.   Skin: Skin is warm and dry, no rash noted, not diaphoretic,no erythema, no pallor.  Cardiovascular: Normal heart rate noted  Respiratory: Effort and breath sounds normal, no problems with respiration noted  Abdomen: Soft, bowel sounds active, non-tender, no abnormal masses  Incision: Healing well, no drainage, no erythema, no hernia, no seroma, no swelling, no dehiscence, incision well approximated  Pelvic:    Intact vaginal cuff, well healed, no granulation tissue   Surgical pathology () Uterus with multiple fibroids and adenomyosis, benign findings  Assessment:   Doing well postoperatively.  Operative findings again reviewed. Pathology report discussed.   Plan:   1. Continue any current medications. 2. Wound care discussed. 3. Activity restrictions: none 4. Anticipated return to work: 1-2 weeks. 5. Follow up as needed 6.  Routine preventative health maintenance measures emphasized. Please refer to After Visit Summary for other counseling recommendations.    Lynnda Shields, MD, [place] Attending [place] for [facility], [place]

## 2022-05-14 NOTE — Progress Notes (Signed)
Pt is in the office for post op follow up after abd hysterectomy on 04/07/22 Pt reports no issues today.

## 2022-05-24 NOTE — Patient Instructions (Addendum)

## 2022-05-24 NOTE — Progress Notes (Signed)
I,Victoria T Hamilton,acting as a Education administrator for [name], FNP.,have documented all relevant documentation on the behalf of [name], FNP,as directed by  [name], FNP while in the presence of [name], [place].    Subjective:     Patient ID: Katherine Arellano , female    DOB: Feb 05, 1976 , 46 y.o.   MRN: [id number]   Chief Complaint  Patient presents with   Hypertension    HPI  Patient is here for htn follow up. Her blood pressure has been a little higher since having her surgery on 04/07/2022. She has not been as active since having her surgery. She does not check her blood pressure.   She reports also having her hysterectomy on 04/07/2022.   Hypertension This is a chronic problem. The current episode started more than 1 year ago. The problem has been gradually improving since onset. The problem is controlled. Pertinent negatives include no anxiety. Risk factors for coronary artery disease include obesity. Past treatments include calcium channel blockers. There are no compliance problems.  There is no history of angina.     Past Medical History:  Diagnosis Date   High blood pressure      Family History  Problem Relation Age of Onset   High blood pressure Mother    Diabetes Father    High blood pressure Father    Cancer Father        prostate    Colon cancer Neg Hx    Esophageal cancer Neg Hx    Rectal cancer Neg Hx    Stomach cancer Neg Hx    Colon polyps Neg Hx      Current Outpatient Medications:    amLODipine (NORVASC) 10 MG tablet, TAKE 1 TABLET BY MOUTH  DAILY, Disp: 90 tablet, Rfl: 3   ferrous sulfate 325 (65 FE) MG tablet, Take 325 mg by mouth daily with breakfast., Disp: , Rfl:    No Known Allergies   Review of Systems  Constitutional: Negative.   Respiratory: Negative.    Cardiovascular: Negative.   Gastrointestinal: Negative.   Neurological: Negative.   Psychiatric/Behavioral: Negative.       Today's Vitals   05/25/22 0844  BP: 132/78  Pulse:  82  Temp: 98.4 F (36.9 C)  SpO2: 98%  Weight: 281 lb 3.2 oz (127.6 kg)  Height: _0  (1.676 m)   Body mass index is 45.39 kg/m.  Wt Readings from Last 3 Encounters:  05/25/22 281 lb 3.2 oz (127.6 kg)  05/14/22 282 lb 4.8 oz (128.1 kg)  04/23/22 279 lb (126.6 kg)    Objective:  Physical Exam Vitals reviewed.  Constitutional:      General: She is not in acute distress.    Appearance: Normal appearance. She is well-developed. She is obese.  HENT:     Head: Normocephalic.  Eyes:     Pupils: Pupils are equal, round, and reactive to light.  Cardiovascular:     Rate and Rhythm: Normal rate and regular rhythm.     Pulses: Normal pulses.     Heart sounds: Normal heart sounds. No murmur heard. Pulmonary:     Effort: Pulmonary effort is normal. No respiratory distress.     Breath sounds: Normal breath sounds. No wheezing.  Musculoskeletal:        General: Normal range of motion.  Skin:    General: Skin is warm and dry.     Capillary Refill: Capillary refill takes less than 2 seconds.  Neurological:  General: No focal deficit present.     Mental Status: She is alert and oriented to person, place, and time.     Cranial Nerves: No cranial nerve deficit.  Psychiatric:        Mood and Affect: Mood normal.        Behavior: Behavior normal.        Thought Content: Thought content normal.        Judgment: Judgment normal.         Assessment And Plan:     1. Essential hypertension Comments: Blood pressure is fairly controlled. BP will likely improve when she becomes more active. Check BP daily and send copy of readings weekly - BMP8+eGFR  2. Elevated cholesterol Comments: Chronic, slightly elevated LDL at last visit. Continue focusing on low fat diet. - Lipid panel  3. Class 3 severe obesity due to excess calories without serious comorbidity with body mass index (BMI) of 45.0 to 49.9 in adult [facility]) She is encouraged to strive for BMI less than 30 to decrease cardiac risk.  Advised to aim for at least 150 minutes of exercise per week.   4. History of hysterectomy Had hysterectomy on 04/07/2022   Patient was given opportunity to ask questions. Patient verbalized understanding of the plan and was able to repeat key elements of the plan. All questions were answered to their satisfaction.  [name], FNP   I, [name], FNP, have reviewed all documentation for this visit. The documentation on 05/25/22 for the exam, diagnosis, procedures, and orders are all accurate and complete.   IF YOU HAVE BEEN REFERRED TO A SPECIALIST, IT MAY TAKE 1-2 WEEKS TO SCHEDULE/PROCESS THE REFERRAL. IF YOU HAVE NOT HEARD FROM US/SPECIALIST IN TWO WEEKS, PLEASE GIVE [place] A CALL AT [phone].   THE PATIENT IS ENCOURAGED TO PRACTICE SOCIAL DISTANCING DUE TO THE COVID-19 PANDEMIC.

## 2022-05-25 ENCOUNTER — Ambulatory Visit: Payer: 59 | Admitting: Nurse Practitioner

## 2022-05-25 ENCOUNTER — Ambulatory Visit (INDEPENDENT_AMBULATORY_CARE_PROVIDER_SITE_OTHER): Payer: 59 | Admitting: Nurse Practitioner

## 2022-05-25 ENCOUNTER — Encounter: Payer: Self-pay | Admitting: Nurse Practitioner

## 2022-05-25 VITALS — BP 132/78 | HR 82 | Temp 98.4°F | Ht 66.0 in | Wt 281.2 lb

## 2022-05-25 DIAGNOSIS — Z6841 Body Mass Index (BMI) 40.0 and over, adult: Secondary | ICD-10-CM | POA: Diagnosis not present

## 2022-05-25 DIAGNOSIS — I10 Essential (primary) hypertension: Secondary | ICD-10-CM | POA: Diagnosis not present

## 2022-05-25 DIAGNOSIS — E7800 Pure hypercholesterolemia, unspecified: Secondary | ICD-10-CM | POA: Insufficient documentation

## 2022-05-25 DIAGNOSIS — Z90710 Acquired absence of both cervix and uterus: Secondary | ICD-10-CM

## 2022-05-25 LAB — BMP8+EGFR
BUN/Creatinine Ratio: 10 (ref 9–23)
BUN: 9 mg/dL (ref 6–24)
CO2: 23 mmol/L (ref 20–29)
Calcium: 9.1 mg/dL (ref 8.7–10.2)
Chloride: 102 mmol/L (ref 96–106)
Creatinine, Ser: 0.88 mg/dL (ref 0.57–1.00)
Glucose: 83 mg/dL (ref 70–99)
Potassium: 3.9 mmol/L (ref 3.5–5.2)
Sodium: 139 mmol/L (ref 134–144)
eGFR: 82 mL/min/{1.73_m2} (ref >59)

## 2022-05-25 LAB — LIPID PANEL
Chol/HDL Ratio: 4.1 ratio (ref 0.0–4.4)
Cholesterol, Total: 180 mg/dL (ref 100–199)
HDL: 44 mg/dL (ref >39)
LDL Chol Calc (NIH): 123 mg/dL — ABNORMAL HIGH (ref 0–99)
Triglycerides: 70 mg/dL (ref 0–149)
VLDL Cholesterol Cal: 13 mg/dL (ref 5–40)

## 2022-06-01 LAB — HM MAMMOGRAPHY: HM Mammogram: NORMAL (ref 0–4)

## 2022-07-02 ENCOUNTER — Ambulatory Visit: Payer: 59 | Admitting: Nurse Practitioner

## 2022-07-07 NOTE — Telephone Encounter (Signed)
Chmg-error.  

## 2022-09-23 ENCOUNTER — Other Ambulatory Visit: Payer: Self-pay | Admitting: Nurse Practitioner

## 2022-09-23 DIAGNOSIS — I10 Essential (primary) hypertension: Secondary | ICD-10-CM

## 2022-11-19 ENCOUNTER — Encounter: Payer: 59 | Admitting: Nurse Practitioner

## 2022-12-05 IMAGING — US US PELVIS COMPLETE WITH TRANSVAGINAL
1 series · 13 of 25 positions shown · non-contrast
Comparison: None Available.

CLINICAL DATA: Pelvic mass on physical exam.

EXAM:
TRANSABDOMINAL AND TRANSVAGINAL ULTRASOUND OF PELVIS
TECHNIQUE: Both transabdominal and transvaginal ultrasound examinations of the
pelvis were performed. Transabdominal technique was performed for
global imaging of the pelvis including uterus, ovaries, adnexal
regions, and pelvic cul-de-sac. It was necessary to proceed with
endovaginal exam following the transabdominal exam to visualize the
adnexal regions.

[Series 1: us pelvis complete with transvaginal · 0.32mm/px · 13 of 82 slices shown]
[im 1/82]
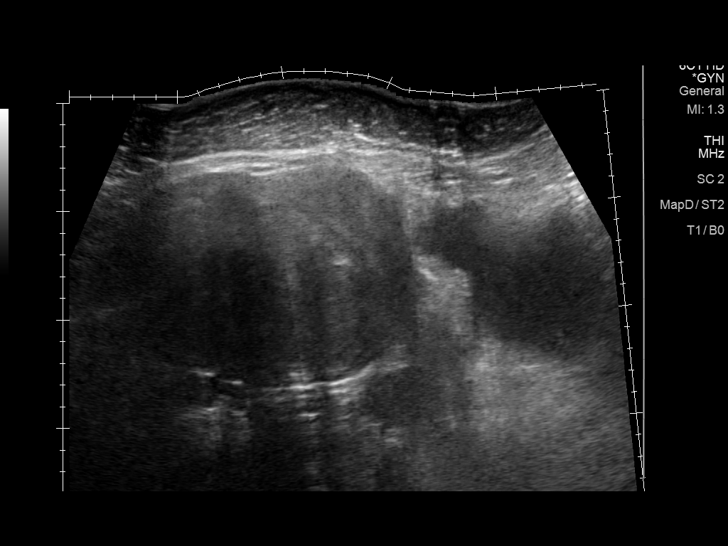
[im 7/82]
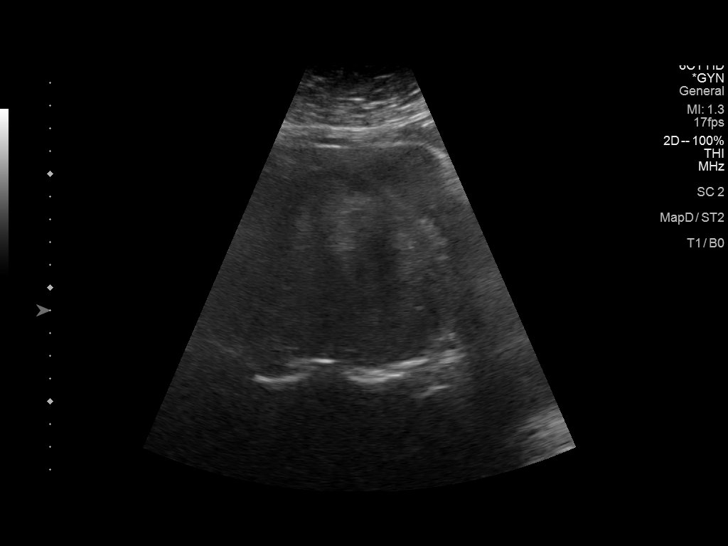
[im 14/82]
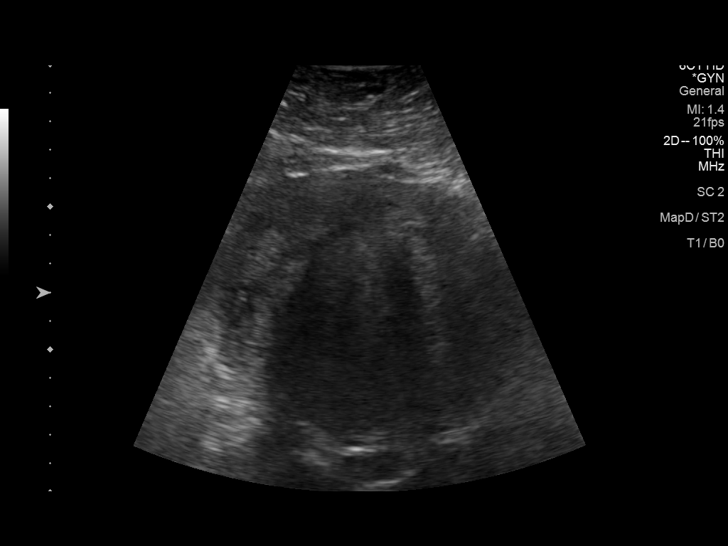
[im 21/82]
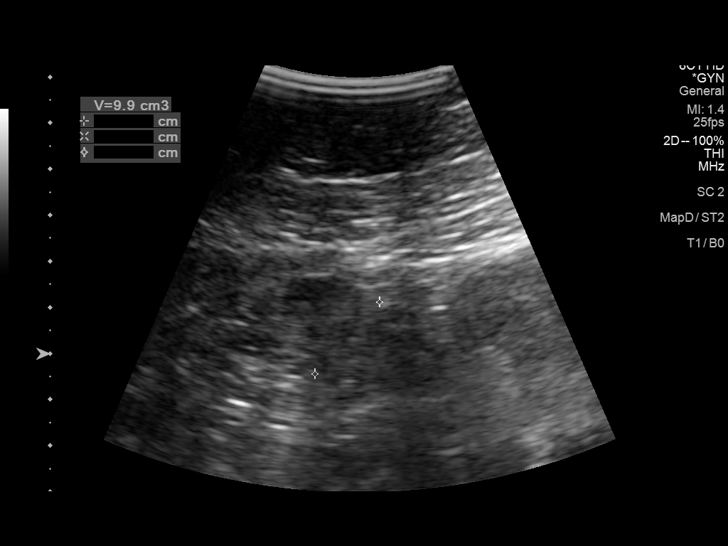
[im 28/82]
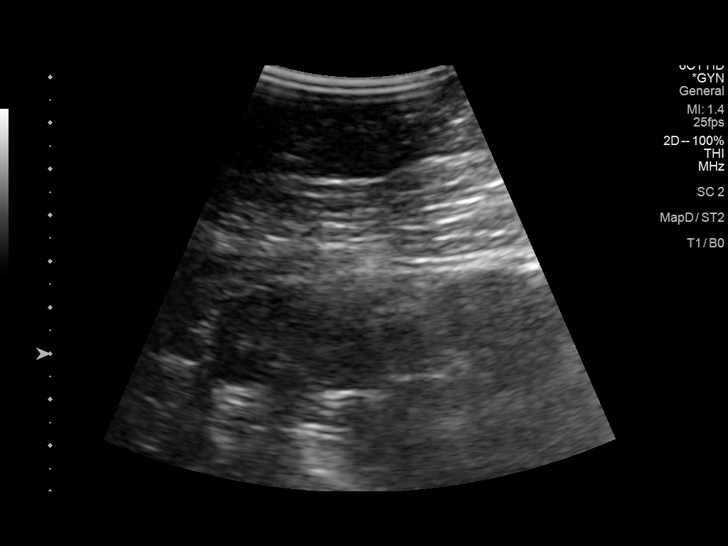
[im 34/82]
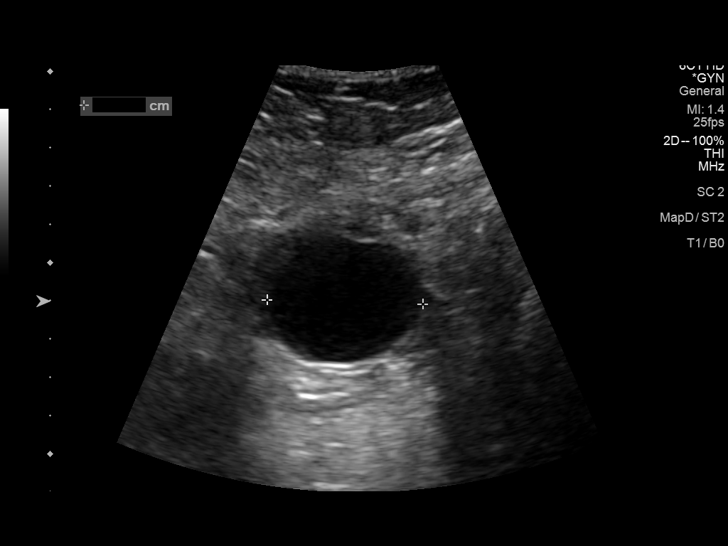
[im 41/82]
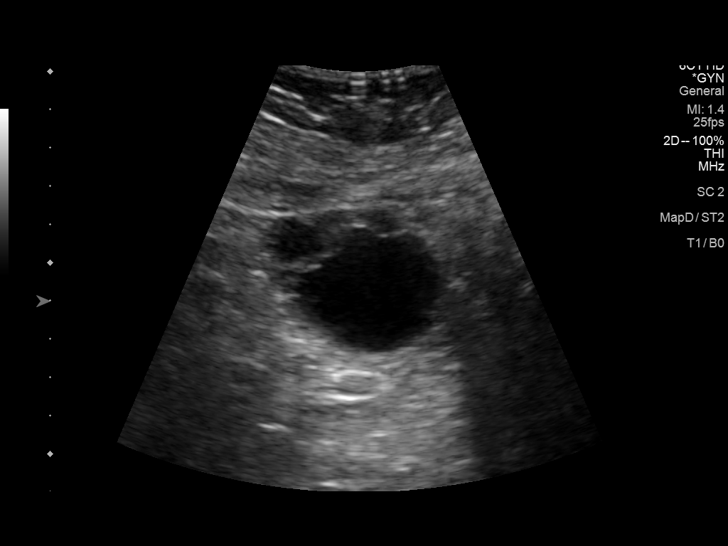
[im 48/82]
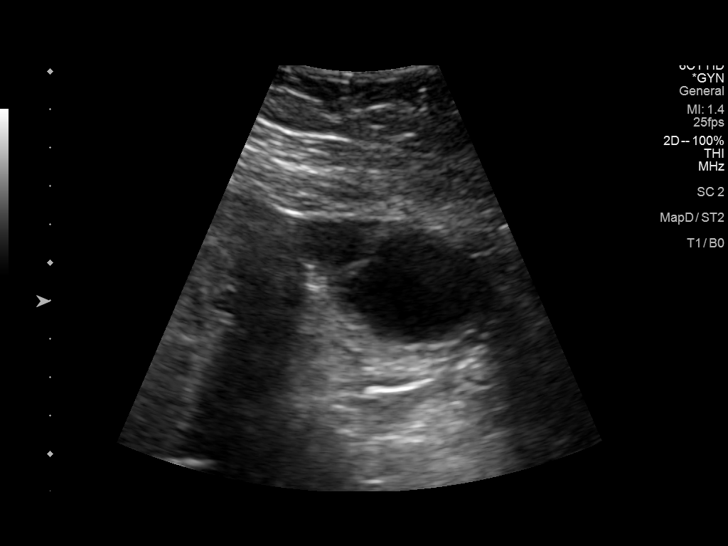
[im 55/82]
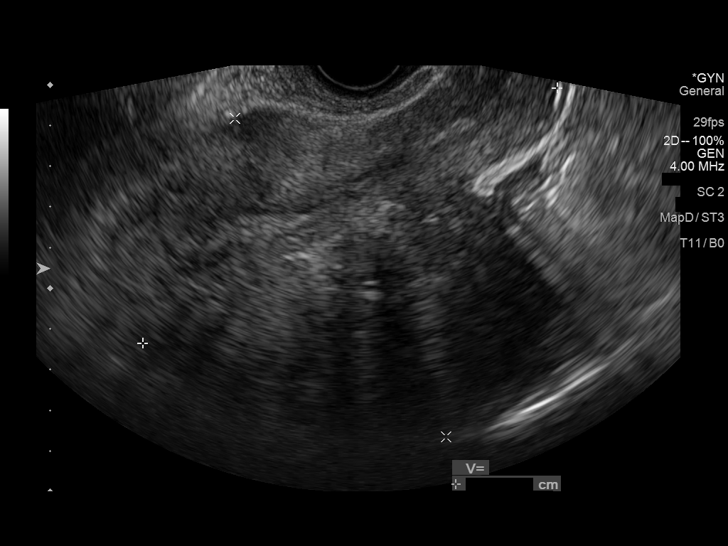
[im 61/82]
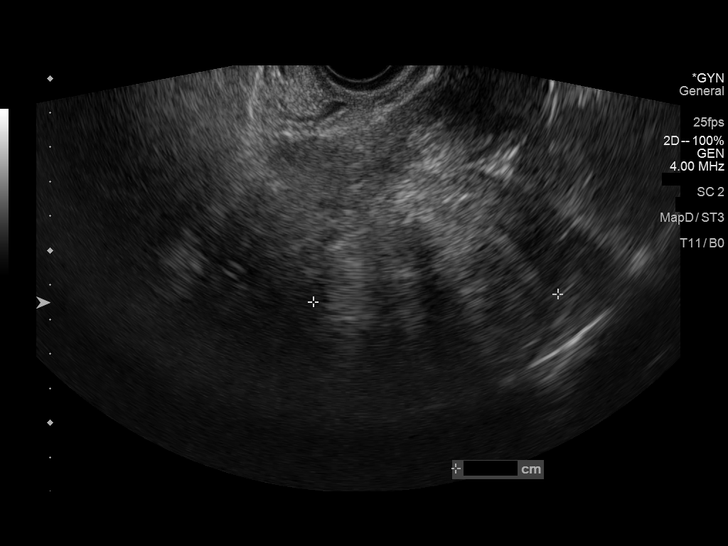
[im 68/82]
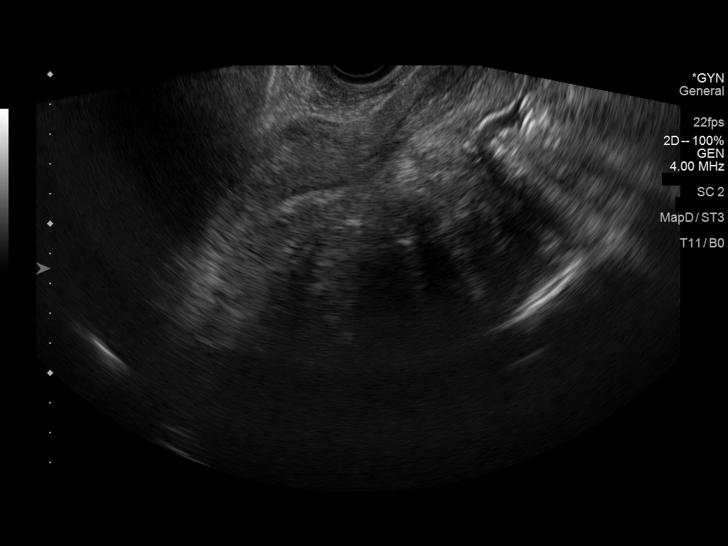
[im 75/82]
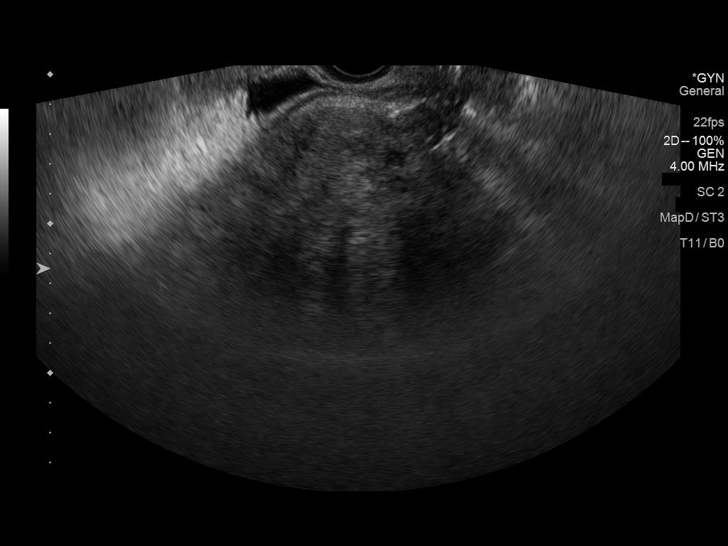
[im 82/82]
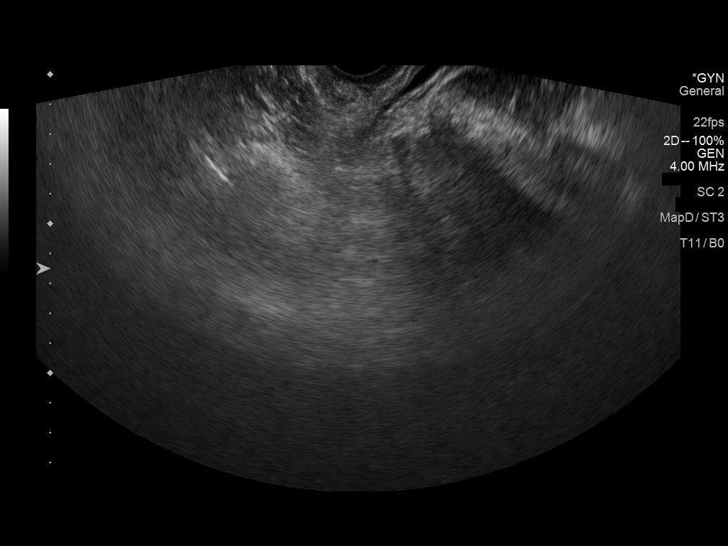

[13 of 25 positions shown; findings below may reference images not displayed]

FINDINGS: Uterus

Measurements: 19.7 x 11.4 x 10.2 cm = volume: 0053 ML. 7.4 x 7.1 x
6.1 cm fibroid is noted posteriorly. 2.4 x 1.9 x 1.3 cm fibroid is
noted anteriorly.

Endometrium

Thickness: 13 mm which is within normal limits. No focal abnormality
visualized.

Right ovary

Measurements: 4.4 x 2.1 x 2.1 cm = volume: 10 mL. Normal
appearance/no adnexal mass.

Left ovary

Measurements: 5.4 x 3.5 x 3.0 cm = volume: 29 mL. 4.1 cm cyst is
noted.

Other findings

No abnormal free fluid.
IMPRESSION: At least 2 uterine fibroids are noted, the largest measuring 7.4 cm
in the posterior portion of the uterus.

4.1 cm left ovarian cyst. No follow up imaging recommended. Note:
This recommendation does not apply to premenarchal patients or to
those with increased risk (genetic, family history, elevated tumor
markers or other high-risk factors) of ovarian cancer. Reference:
Radiology [DATE]):[id number].

## 2022-12-29 ENCOUNTER — Ambulatory Visit (INDEPENDENT_AMBULATORY_CARE_PROVIDER_SITE_OTHER): Payer: 59 | Admitting: Nurse Practitioner

## 2022-12-29 ENCOUNTER — Encounter: Payer: Self-pay | Admitting: Nurse Practitioner

## 2022-12-29 VITALS — BP 130/80 | HR 77 | Temp 98.7°F | Ht 66.0 in | Wt 279.0 lb

## 2022-12-29 DIAGNOSIS — Z79899 Other long term (current) drug therapy: Secondary | ICD-10-CM

## 2022-12-29 DIAGNOSIS — Z0000 Encounter for general adult medical examination without abnormal findings: Secondary | ICD-10-CM | POA: Diagnosis not present

## 2022-12-29 DIAGNOSIS — E7800 Pure hypercholesterolemia, unspecified: Secondary | ICD-10-CM

## 2022-12-29 DIAGNOSIS — E66813 Obesity, class 3: Secondary | ICD-10-CM | POA: Insufficient documentation

## 2022-12-29 DIAGNOSIS — I10 Essential (primary) hypertension: Secondary | ICD-10-CM | POA: Diagnosis not present

## 2022-12-29 DIAGNOSIS — Z6841 Body Mass Index (BMI) 40.0 and over, adult: Secondary | ICD-10-CM

## 2022-12-29 HISTORY — DX: Obesity, class 3: E66.813

## 2022-12-29 LAB — POCT URINALYSIS DIP (CLINITEK)
Bilirubin, UA: NEGATIVE
Blood, UA: NEGATIVE
Glucose, UA: NEGATIVE mg/dL
Ketones, POC UA: NEGATIVE mg/dL
Leukocytes, UA: NEGATIVE
Nitrite, UA: NEGATIVE
POC PROTEIN,UA: NEGATIVE
Spec Grav, UA: 1.02 (ref 1.010–1.025)
Urobilinogen, UA: 0.2 E.U./dL
pH, UA: 6.5 (ref 5.0–8.0)

## 2022-12-29 NOTE — Progress Notes (Addendum)
I,Ciera Martin,acting as a Neurosurgeon for Arnette Felts, FNP.,have documented all relevant documentation on the behalf of Arnette Felts, FNP,as directed by  Arnette Felts, FNP while in the presence of Arnette Felts, FNP.  Subjective:    Patient ID: Katherine Arellano , female    DOB: [birth year] , 47 y.o.   MRN: [id number]  Chief Complaint  Patient presents with   Annual Exam    HPI  Patient presents today for HM, patient reports compliance with medications and has no other concerns today. Patient denies any chest pain, SOB, or headache.  BP Readings from Last 3 Encounters: 12/29/22 : 130/80 05/25/22 : 132/78 05/14/22 : 132/82   Wt Readings from Last 3 Encounters: 12/29/22 : 279 lb (126.6 kg) 05/25/22 : 281 lb 3.2 oz (127.6 kg) 05/14/22 : 282 lb 4.8 oz (128.1 kg)       Past Medical History:  Diagnosis Date   High blood pressure      Family History  Problem Relation Age of Onset   High blood pressure Mother    Hypertension Mother    Diabetes Father    High blood pressure Father    Cancer Father        prostate    Colon cancer Neg Hx    Esophageal cancer Neg Hx    Rectal cancer Neg Hx    Stomach cancer Neg Hx    Colon polyps Neg Hx      Current Outpatient Medications:    amLODipine (NORVASC) 10 MG tablet, TAKE 1 TABLET BY MOUTH DAILY, Disp: 90 tablet, Rfl: 3   ferrous sulfate 325 (65 FE) MG tablet, Take 325 mg by mouth daily with breakfast., Disp: , Rfl:    No Known Allergies    The patient states she uses status post hysterectomy on April 09, 2023. Patient's last menstrual period was 03/07/2022.. Negative for: breast discharge, breast lump(s), breast pain and breast self exam. Associated symptoms include abnormal vaginal bleeding. Pertinent negatives include abnormal bleeding (hematology), anxiety, decreased libido, depression, difficulty falling sleep, dyspareunia, history of infertility, nocturia, sexual dysfunction, sleep disturbances, urinary incontinence, urinary  urgency, vaginal discharge and vaginal itching. Diet regular; she has been working on her cholesterol since her last visit.  Water intake is mostly good at 64 oz. The patient states her exercise level is moderate with walking daily 1 - 1.5 hours, 2 strength training per week and 2 aerobic exercise.   The patient's tobacco use is:  Social History   Tobacco Use  Smoking Status Never  Smokeless Tobacco Never   She has been exposed to passive smoke. The patient's alcohol use is:  Social History   Substance and Sexual Activity  Alcohol Use Not Currently   Comment: More like once a month    Review of Systems  Constitutional: Negative.   HENT: Negative.    Eyes: Negative.   Respiratory: Negative.    Cardiovascular: Negative.   Gastrointestinal: Negative.   Endocrine: Negative.   Genitourinary: Negative.   Musculoskeletal: Negative.   Skin: Negative.   Allergic/Immunologic: Negative.   Neurological: Negative.   Hematological: Negative.   Psychiatric/Behavioral: Negative.       Today's Vitals   12/29/22 1557  BP: 130/80  Pulse: 77  Temp: 98.7 F (37.1 C)  TempSrc: Oral  Weight: 279 lb (126.6 kg)  Height: 5\' 6"  (1.676 m)  PainSc: 0-No pain   Body mass index is 45.03 kg/m.  Wt Readings from Last 3 Encounters:  12/29/22 279 lb (126.6 kg)  05/25/22 281 lb 3.2 oz (127.6 kg)  05/14/22 282 lb 4.8 oz (128.1 kg)     Objective:  Physical Exam Vitals reviewed.  Constitutional:      General: She is not in acute distress.    Appearance: Normal appearance. She is well-developed. She is obese.  HENT:     Head: Normocephalic and atraumatic.     Right Ear: Hearing, tympanic membrane, ear canal and external ear normal. There is no impacted cerumen.     Left Ear: Hearing, tympanic membrane, ear canal and external ear normal. There is no impacted cerumen.     Nose: Nose normal.     Mouth/Throat:     Mouth: Mucous membranes are moist.  Eyes:     General: Lids are normal.      Extraocular Movements: Extraocular movements intact.     Conjunctiva/sclera: Conjunctivae normal.     Pupils: Pupils are equal, round, and reactive to light.     Funduscopic exam:    Right eye: No papilledema.        Left eye: No papilledema.  Neck:     Thyroid: No thyroid mass.     Vascular: No carotid bruit.  Cardiovascular:     Rate and Rhythm: Normal rate and regular rhythm.     Pulses: Normal pulses.     Heart sounds: Normal heart sounds. No murmur heard. Pulmonary:     Effort: Pulmonary effort is normal. No respiratory distress.     Breath sounds: Normal breath sounds. No wheezing.  Chest:     Chest wall: No mass.  Breasts:    Tanner Score is 5.     Right: Normal. No mass or tenderness.     Left: Normal. No mass or tenderness.  Abdominal:     General: Abdomen is flat. Bowel sounds are normal. There is no distension.     Palpations: There is no mass.     Tenderness: There is no abdominal tenderness.  Genitourinary:    Cervix: Normal.     Uterus: Normal.      Adnexa: Right adnexa normal and left adnexa normal.       Right: No mass.         Left: No mass.       Comments: Deferred - followed by GYN Musculoskeletal:        General: No swelling or tenderness. Normal range of motion.     Cervical back: Full passive range of motion without pain, normal range of motion and neck supple.     Right lower leg: No edema.     Left lower leg: No edema.  Lymphadenopathy:     Upper Body:     Right upper body: No supraclavicular, axillary or pectoral adenopathy.     Left upper body: No supraclavicular, axillary or pectoral adenopathy.  Skin:    General: Skin is warm and dry.     Capillary Refill: Capillary refill takes less than 2 seconds.     Coloration: Skin is not jaundiced.     Findings: No bruising.  Neurological:     General: No focal deficit present.     Mental Status: She is alert and oriented to person, place, and time.     Cranial Nerves: No cranial nerve deficit.      Sensory: No sensory deficit.     Motor: No weakness.  Psychiatric:        Mood and Affect: Mood normal.        Behavior: Behavior  normal.        Thought Content: Thought content normal.        Judgment: Judgment normal.         Assessment And Plan:     Annual physical exam Assessment & Plan: Behavior modifications discussed and diet history reviewed.   Pt will continue to exercise regularly and modify diet with low GI, plant based foods and decrease intake of processed foods.  Recommend intake of daily multivitamin, Vitamin D, and calcium.  Recommend mammogram and colonoscopy for preventive screenings, as well as recommend immunizations that include influenza, TDAP    Essential hypertension Assessment & Plan: Blood pressure is stable. Continue current medications. EKG done with Sinus Rhythm, WITHIN NORMAL LIMITS HR 75   Orders: -     POCT URINALYSIS DIP (CLINITEK) -     Microalbumin / creatinine urine ratio -     EKG 12-Lead -     CMP14+EGFR  Elevated cholesterol Assessment & Plan: Chronic, slightly elevated LDL at last visit. Continue focusing on low fat diet.   Orders: -     Lipid panel  Class 3 severe obesity due to excess calories without serious comorbidity with body mass index (BMI) of 45.0 to 49.9 in adult [facility]) Assessment & Plan: She is encouraged to strive for BMI less than 30 to decrease cardiac risk. Advised to aim for at least 150 minutes of exercise per week.    Other long term (current) drug therapy -     CBC with Differential/Platelet     Return for 1 year physical, 6 month bp check. Patient was given opportunity to ask questions. Patient verbalized understanding of the plan and was able to repeat key elements of the plan. All questions were answered to their satisfaction.   Arnette Felts, FNP  I, Arnette Felts, FNP, have reviewed all documentation for this visit. The documentation on 12/29/22 for the exam, diagnosis, procedures, and orders are all  accurate and complete.

## 2022-12-29 NOTE — Assessment & Plan Note (Signed)
Blood pressure is stable. Continue current medications. EKG done with Sinus Rhythm, WITHIN NORMAL LIMITS HR 75

## 2022-12-30 LAB — CBC WITH DIFFERENTIAL/PLATELET
Basophils Absolute: 0.1 10*3/uL (ref 0.0–0.2)
Basos: 1 %
EOS (ABSOLUTE): 0.1 10*3/uL (ref 0.0–0.4)
Eos: 2 %
Hematocrit: 37 % (ref 34.0–46.6)
Hemoglobin: 12.1 g/dL (ref 11.1–15.9)
Immature Grans (Abs): 0 10*3/uL (ref 0.0–0.1)
Immature Granulocytes: 0 %
Lymphocytes Absolute: 1.9 10*3/uL (ref 0.7–3.1)
Lymphs: 31 %
MCH: 28.4 pg (ref 26.6–33.0)
MCHC: 32.7 g/dL (ref 31.5–35.7)
MCV: 87 fL (ref 79–97)
Monocytes Absolute: 0.5 10*3/uL (ref 0.1–0.9)
Monocytes: 8 %
Neutrophils Absolute: 3.6 10*3/uL (ref 1.4–7.0)
Neutrophils: 58 %
Platelets: 343 10*3/uL (ref 150–450)
RBC: 4.26 x10E6/uL (ref 3.77–5.28)
RDW: 11.8 % (ref 11.7–15.4)
WBC: 6.1 10*3/uL (ref 3.4–10.8)

## 2022-12-30 LAB — LIPID PANEL
Chol/HDL Ratio: 3.5 ratio (ref 0.0–4.4)
Cholesterol, Total: 164 mg/dL (ref 100–199)
HDL: 47 mg/dL (ref >39)
LDL Chol Calc (NIH): 104 mg/dL — ABNORMAL HIGH (ref 0–99)
Triglycerides: 68 mg/dL (ref 0–149)
VLDL Cholesterol Cal: 13 mg/dL (ref 5–40)

## 2022-12-30 LAB — CMP14+EGFR
ALT: 8 IU/L (ref 0–32)
AST: 14 IU/L (ref 0–40)
Albumin: 4.4 g/dL (ref 3.9–4.9)
Alkaline Phosphatase: 52 IU/L (ref 44–121)
BUN/Creatinine Ratio: 11 (ref 9–23)
BUN: 10 mg/dL (ref 6–24)
Bilirubin Total: 0.5 mg/dL (ref 0.0–1.2)
CO2: 21 mmol/L (ref 20–29)
Calcium: 8.9 mg/dL (ref 8.7–10.2)
Chloride: 104 mmol/L (ref 96–106)
Creatinine, Ser: 0.94 mg/dL (ref 0.57–1.00)
Globulin, Total: 2.5 g/dL (ref 1.5–4.5)
Glucose: 76 mg/dL (ref 70–99)
Potassium: 3.9 mmol/L (ref 3.5–5.2)
Sodium: 138 mmol/L (ref 134–144)
Total Protein: 6.9 g/dL (ref 6.0–8.5)
eGFR: 75 mL/min/{1.73_m2} (ref >59)

## 2022-12-30 LAB — MICROALBUMIN / CREATININE URINE RATIO
Creatinine, Urine: 83.2 mg/dL
Microalb/Creat Ratio: <4 mg/g creat (ref 0–29)
Microalbumin, Urine: <3 ug/mL

## 2023-01-18 NOTE — Assessment & Plan Note (Signed)
Chronic, slightly elevated LDL at last visit. Continue focusing on low fat diet.

## 2023-01-18 NOTE — Assessment & Plan Note (Signed)
She is encouraged to strive for BMI less than 30 to decrease cardiac risk. Advised to aim for at least 150 minutes of exercise per week.  

## 2023-01-18 NOTE — Assessment & Plan Note (Signed)

## 2023-02-22 ENCOUNTER — Encounter: Payer: Self-pay | Admitting: Nurse Practitioner

## 2023-03-01 ENCOUNTER — Encounter: Payer: Self-pay | Admitting: Nurse Practitioner

## 2023-03-02 ENCOUNTER — Ambulatory Visit: Payer: 59

## 2023-03-03 ENCOUNTER — Ambulatory Visit: Payer: 59

## 2023-04-15 ENCOUNTER — Inpatient Hospital Stay (HOSPITAL_COMMUNITY)
Admission: EM | Admit: 2023-04-15 | Discharge: 2023-04-17 | DRG: 398 | Disposition: A | Discharge status: Home / Self Care | Payer: 59 | Attending: General Surgery | Admitting: General Surgery

## 2023-04-15 ENCOUNTER — Emergency Department (HOSPITAL_COMMUNITY): Payer: 59

## 2023-04-15 ENCOUNTER — Other Ambulatory Visit: Payer: Self-pay

## 2023-04-15 ENCOUNTER — Ambulatory Visit
Admission: RE | Admit: 2023-04-15 | Discharge: 2023-04-15 | Disposition: A | Discharge status: Home / Self Care | Payer: 59 | Source: Ambulatory Visit | Attending: Family Medicine | Admitting: Family Medicine

## 2023-04-15 ENCOUNTER — Inpatient Hospital Stay (HOSPITAL_COMMUNITY): Payer: 59 | Admitting: Anesthesiology

## 2023-04-15 ENCOUNTER — Encounter (HOSPITAL_COMMUNITY): Payer: Self-pay

## 2023-04-15 ENCOUNTER — Encounter (HOSPITAL_COMMUNITY): Admission: EM | Disposition: A | Discharge status: Home / Self Care | Payer: Self-pay | Source: Home / Self Care

## 2023-04-15 VITALS — BP 104/73 | HR 107 | Temp 99.8°F | Resp 18

## 2023-04-15 DIAGNOSIS — Z6841 Body Mass Index (BMI) 40.0 and over, adult: Secondary | ICD-10-CM | POA: Diagnosis not present

## 2023-04-15 DIAGNOSIS — K3532 Acute appendicitis with perforation and localized peritonitis, without abscess: Principal | ICD-10-CM | POA: Diagnosis present

## 2023-04-15 DIAGNOSIS — K3580 Unspecified acute appendicitis: Secondary | ICD-10-CM | POA: Diagnosis not present

## 2023-04-15 DIAGNOSIS — Z833 Family history of diabetes mellitus: Secondary | ICD-10-CM

## 2023-04-15 DIAGNOSIS — Z79899 Other long term (current) drug therapy: Secondary | ICD-10-CM | POA: Diagnosis not present

## 2023-04-15 DIAGNOSIS — I10 Essential (primary) hypertension: Secondary | ICD-10-CM | POA: Diagnosis present

## 2023-04-15 DIAGNOSIS — Z8249 Family history of ischemic heart disease and other diseases of the circulatory system: Secondary | ICD-10-CM

## 2023-04-15 DIAGNOSIS — R1084 Generalized abdominal pain: Secondary | ICD-10-CM

## 2023-04-15 DIAGNOSIS — K5641 Fecal impaction: Secondary | ICD-10-CM | POA: Diagnosis present

## 2023-04-15 DIAGNOSIS — E669 Obesity, unspecified: Secondary | ICD-10-CM | POA: Diagnosis present

## 2023-04-15 DIAGNOSIS — K37 Unspecified appendicitis: Secondary | ICD-10-CM

## 2023-04-15 DIAGNOSIS — Z90710 Acquired absence of both cervix and uterus: Secondary | ICD-10-CM

## 2023-04-15 HISTORY — PX: LAPAROSCOPIC APPENDECTOMY: SHX408

## 2023-04-15 HISTORY — DX: Acute appendicitis with perforation, localized peritonitis, and gangrene, without abscess: K35.32

## 2023-04-15 HISTORY — DX: Unspecified appendicitis: K37

## 2023-04-15 LAB — LIPASE, BLOOD: Lipase: 29 U/L (ref 11–51)

## 2023-04-15 LAB — COMPREHENSIVE METABOLIC PANEL
ALT: 9 U/L (ref 0–44)
AST: 12 U/L — ABNORMAL LOW (ref 15–41)
Albumin: 3.8 g/dL (ref 3.5–5.0)
Alkaline Phosphatase: 51 U/L (ref 38–126)
Anion gap: 8 (ref 5–15)
BUN: 8 mg/dL (ref 6–20)
CO2: 22 mmol/L (ref 22–32)
Calcium: 9 mg/dL (ref 8.9–10.3)
Chloride: 104 mmol/L (ref 98–111)
Creatinine, Ser: 0.93 mg/dL (ref 0.44–1.00)
GFR, Estimated: >60 mL/min (ref >60)
Glucose, Bld: 102 mg/dL — ABNORMAL HIGH (ref 70–99)
Potassium: 3.8 mmol/L (ref 3.5–5.1)
Sodium: 134 mmol/L — ABNORMAL LOW (ref 135–145)
Total Bilirubin: 1.1 mg/dL (ref 0.3–1.2)
Total Protein: 7.6 g/dL (ref 6.5–8.1)

## 2023-04-15 LAB — URINALYSIS, ROUTINE W REFLEX MICROSCOPIC
Bilirubin Urine: NEGATIVE
Glucose, UA: NEGATIVE mg/dL
Hgb urine dipstick: NEGATIVE
Ketones, ur: 80 mg/dL — AB
Leukocytes,Ua: NEGATIVE
Nitrite: NEGATIVE
Protein, ur: 100 mg/dL — AB
Specific Gravity, Urine: 1.026 (ref 1.005–1.030)
pH: 5 (ref 5.0–8.0)

## 2023-04-15 LAB — CBC
HCT: 40.2 % (ref 36.0–46.0)
Hemoglobin: 13.4 g/dL (ref 12.0–15.0)
MCH: 28.8 pg (ref 26.0–34.0)
MCHC: 33.3 g/dL (ref 30.0–36.0)
MCV: 86.3 fL (ref 80.0–100.0)
Platelets: 355 10*3/uL (ref 150–400)
RBC: 4.66 MIL/uL (ref 3.87–5.11)
RDW: 13.2 % (ref 11.5–15.5)
WBC: 17.5 10*3/uL — ABNORMAL HIGH (ref 4.0–10.5)
nRBC: 0 % (ref 0.0–0.2)

## 2023-04-15 SURGERY — APPENDECTOMY, LAPAROSCOPIC
Anesthesia: General

## 2023-04-15 MED ORDER — IOHEXOL 350 MG/ML SOLN
75.0000 mL | Freq: Once | INTRAVENOUS | Status: AC | PRN
  Administered 2023-04-15: 75 mL via INTRAVENOUS

## 2023-04-15 MED ORDER — ACETAMINOPHEN 160 MG/5ML PO SOLN: 325.0000 mg | ORAL | Status: DC | PRN

## 2023-04-15 MED ORDER — BUPIVACAINE-EPINEPHRINE 0.25% -1:200000 IJ SOLN
INTRAMUSCULAR | Status: DC | PRN
  Administered 2023-04-15: 20 mL

## 2023-04-15 MED ORDER — SUGAMMADEX SODIUM 200 MG/2ML IV SOLN
INTRAVENOUS | Status: DC | PRN
  Administered 2023-04-15: 200 mg via INTRAVENOUS

## 2023-04-15 MED ORDER — ACETAMINOPHEN 10 MG/ML IV SOLN
1000.0000 mg | Freq: Once | INTRAVENOUS | Status: DC | PRN
  Administered 2023-04-15: 1000 mg via INTRAVENOUS

## 2023-04-15 MED ORDER — LACTATED RINGERS IV SOLN
INTRAVENOUS | Status: DC | PRN
Start: 2023-04-15 — End: 2023-04-15

## 2023-04-15 MED ORDER — HYDROMORPHONE HCL 1 MG/ML IJ SOLN: 1.0000 mg | INTRAMUSCULAR | Status: DC | PRN

## 2023-04-15 MED ORDER — KETOROLAC TROMETHAMINE 15 MG/ML IJ SOLN
15.0000 mg | Freq: Four times a day (QID) | INTRAMUSCULAR | Status: DC | PRN
  Administered 2023-04-16: 15 mg via INTRAVENOUS
  Filled 2023-04-15: qty 1

## 2023-04-15 MED ORDER — OXYCODONE HCL 5 MG/5ML PO SOLN: 5.0000 mg | Freq: Once | ORAL | Status: DC | PRN

## 2023-04-15 MED ORDER — DIPHENHYDRAMINE HCL 50 MG/ML IJ SOLN: 25.0000 mg | Freq: Four times a day (QID) | INTRAMUSCULAR | Status: DC | PRN

## 2023-04-15 MED ORDER — 0.9 % SODIUM CHLORIDE (POUR BTL) OPTIME
TOPICAL | Status: DC | PRN
  Administered 2023-04-15: 1000 mL

## 2023-04-15 MED ORDER — BUPIVACAINE-EPINEPHRINE (PF) 0.25% -1:200000 IJ SOLN
INTRAMUSCULAR | Status: AC
  Filled 2023-04-15: qty 30

## 2023-04-15 MED ORDER — OXYCODONE HCL 5 MG PO TABS
5.0000 mg | ORAL_TABLET | ORAL | Status: DC | PRN
  Filled 2023-04-15: qty 2

## 2023-04-15 MED ORDER — ONDANSETRON HCL 4 MG/2ML IJ SOLN
INTRAMUSCULAR | Status: DC | PRN
  Administered 2023-04-15: 4 mg via INTRAVENOUS

## 2023-04-15 MED ORDER — FENTANYL CITRATE (PF) 250 MCG/5ML IJ SOLN
INTRAMUSCULAR | Status: AC
  Filled 2023-04-15: qty 5

## 2023-04-15 MED ORDER — SODIUM CHLORIDE 0.9 % IV SOLN: 2.0000 g | INTRAVENOUS | Status: DC

## 2023-04-15 MED ORDER — ONDANSETRON 4 MG PO TBDP: 4.0000 mg | ORAL_TABLET | Freq: Four times a day (QID) | ORAL | Status: DC | PRN

## 2023-04-15 MED ORDER — SODIUM CHLORIDE 0.9 % IV SOLN
2.0000 g | Freq: Once | INTRAVENOUS | Status: AC
  Administered 2023-04-15: 2 g via INTRAVENOUS
  Filled 2023-04-15: qty 20

## 2023-04-15 MED ORDER — ACETAMINOPHEN 500 MG PO TABS
1000.0000 mg | ORAL_TABLET | Freq: Four times a day (QID) | ORAL | Status: DC
  Administered 2023-04-16 – 2023-04-17 (×5): 1000 mg via ORAL
  Filled 2023-04-15 (×6): qty 2

## 2023-04-15 MED ORDER — METRONIDAZOLE 500 MG/100ML IV SOLN: 500.0000 mg | Freq: Two times a day (BID) | INTRAVENOUS | Status: DC

## 2023-04-15 MED ORDER — OXYCODONE HCL 5 MG PO TABS: 5.0000 mg | ORAL_TABLET | Freq: Once | ORAL | Status: DC | PRN

## 2023-04-15 MED ORDER — MIDAZOLAM HCL 2 MG/2ML IJ SOLN
INTRAMUSCULAR | Status: DC | PRN
  Administered 2023-04-15: 2 mg via INTRAVENOUS

## 2023-04-15 MED ORDER — MORPHINE SULFATE (PF) 4 MG/ML IV SOLN
4.0000 mg | Freq: Once | INTRAVENOUS | Status: AC
  Administered 2023-04-15: 4 mg via INTRAVENOUS
  Filled 2023-04-15: qty 1

## 2023-04-15 MED ORDER — ENOXAPARIN SODIUM 40 MG/0.4ML IJ SOSY
40.0000 mg | PREFILLED_SYRINGE | INTRAMUSCULAR | Status: DC
  Administered 2023-04-16: 40 mg via SUBCUTANEOUS
  Filled 2023-04-15: qty 0.4

## 2023-04-15 MED ORDER — SODIUM CHLORIDE 0.9 % IR SOLN
Status: DC | PRN
  Administered 2023-04-15: 500 mL

## 2023-04-15 MED ORDER — ONDANSETRON HCL 4 MG/2ML IJ SOLN: 4.0000 mg | Freq: Four times a day (QID) | INTRAMUSCULAR | Status: DC | PRN

## 2023-04-15 MED ORDER — DROPERIDOL 2.5 MG/ML IJ SOLN: 0.6250 mg | Freq: Once | INTRAMUSCULAR | Status: DC | PRN

## 2023-04-15 MED ORDER — DEXMEDETOMIDINE HCL IN NACL 80 MCG/20ML IV SOLN
INTRAVENOUS | Status: DC | PRN
Start: 2023-04-15 — End: 2023-04-15
  Administered 2023-04-15: 8 ug via INTRAVENOUS

## 2023-04-15 MED ORDER — PROPOFOL 10 MG/ML IV BOLUS
INTRAVENOUS | Status: DC | PRN
  Administered 2023-04-15: 200 mg via INTRAVENOUS

## 2023-04-15 MED ORDER — DIPHENHYDRAMINE HCL 25 MG PO CAPS: 25.0000 mg | ORAL_CAPSULE | Freq: Four times a day (QID) | ORAL | Status: DC | PRN

## 2023-04-15 MED ORDER — FENTANYL CITRATE (PF) 100 MCG/2ML IJ SOLN
25.0000 ug | INTRAMUSCULAR | Status: DC | PRN
  Administered 2023-04-15: 50 ug via INTRAVENOUS

## 2023-04-15 MED ORDER — FENTANYL CITRATE (PF) 250 MCG/5ML IJ SOLN
INTRAMUSCULAR | Status: DC | PRN
  Administered 2023-04-15: 50 ug via INTRAVENOUS
  Administered 2023-04-15: 100 ug via INTRAVENOUS

## 2023-04-15 MED ORDER — ACETAMINOPHEN 325 MG PO TABS: 325.0000 mg | ORAL_TABLET | ORAL | Status: DC | PRN

## 2023-04-15 MED ORDER — METOPROLOL TARTRATE 5 MG/5ML IV SOLN: 5.0000 mg | Freq: Four times a day (QID) | INTRAVENOUS | Status: DC | PRN

## 2023-04-15 MED ORDER — FENTANYL CITRATE (PF) 100 MCG/2ML IJ SOLN
INTRAMUSCULAR | Status: AC
  Filled 2023-04-15: qty 2

## 2023-04-15 MED ORDER — METRONIDAZOLE 500 MG/100ML IV SOLN
500.0000 mg | Freq: Once | INTRAVENOUS | Status: AC
  Administered 2023-04-15: 500 mg via INTRAVENOUS
  Filled 2023-04-15: qty 100

## 2023-04-15 MED ORDER — MIDAZOLAM HCL 2 MG/2ML IJ SOLN
INTRAMUSCULAR | Status: AC
  Filled 2023-04-15: qty 2

## 2023-04-15 MED ORDER — ROCURONIUM BROMIDE 10 MG/ML (PF) SYRINGE
PREFILLED_SYRINGE | INTRAVENOUS | Status: DC | PRN
  Administered 2023-04-15: 50 mg via INTRAVENOUS
  Administered 2023-04-15: 10 mg via INTRAVENOUS

## 2023-04-15 MED ORDER — ACETAMINOPHEN 10 MG/ML IV SOLN
INTRAVENOUS | Status: AC
  Filled 2023-04-15: qty 100

## 2023-04-15 MED ORDER — DEXAMETHASONE SODIUM PHOSPHATE 10 MG/ML IJ SOLN
INTRAMUSCULAR | Status: DC | PRN
  Administered 2023-04-15: 4 mg via INTRAVENOUS

## 2023-04-15 MED ORDER — ONDANSETRON HCL 4 MG/2ML IJ SOLN
4.0000 mg | Freq: Once | INTRAMUSCULAR | Status: AC
  Administered 2023-04-15: 4 mg via INTRAVENOUS
  Filled 2023-04-15: qty 2

## 2023-04-15 SURGICAL SUPPLY — 46 items
ADH SKN CLS APL DERMABOND .7 (GAUZE/BANDAGES/DRESSINGS) ×1
APL PRP STRL LF DISP 70% ISPRP (MISCELLANEOUS) ×1
APPLIER CLIP 5 13 M/L LIGAMAX5 (MISCELLANEOUS)
APR CLP MED LRG 5 ANG JAW (MISCELLANEOUS)
BAG COUNTER SPONGE SURGICOUNT (BAG) ×1 IMPLANT
BAG SPEC RTRVL 10 TROC 200 (ENDOMECHANICALS) ×1
BAG SPNG CNTER NS LX DISP (BAG) ×1
CANISTER SUCT 3000ML PPV (MISCELLANEOUS) ×1 IMPLANT
CHLORAPREP W/TINT 26 (MISCELLANEOUS) ×1 IMPLANT
CLIP APPLIE 5 13 M/L LIGAMAX5 (MISCELLANEOUS) IMPLANT
CLIP LIGATING HEMO LOK XL GOLD (MISCELLANEOUS) ×1 IMPLANT
COVER SURGICAL LIGHT HANDLE (MISCELLANEOUS) ×1 IMPLANT
CUTTER FLEX LINEAR 45M (STAPLE) IMPLANT
DERMABOND ADVANCED .7 DNX12 (GAUZE/BANDAGES/DRESSINGS) ×1 IMPLANT
ELECT REM PT RETURN 9FT ADLT (ELECTROSURGICAL) ×1
ELECTRODE REM PT RTRN 9FT ADLT (ELECTROSURGICAL) ×1 IMPLANT
ENDOLOOP SUT PDS II 0 18 (SUTURE) IMPLANT
GLOVE BIOGEL PI IND STRL 7.0 (GLOVE) ×1 IMPLANT
GLOVE SURG SS PI 7.0 STRL IVOR (GLOVE) ×1 IMPLANT
GOWN STRL REUS W/ TWL LRG LVL3 (GOWN DISPOSABLE) ×3 IMPLANT
GOWN STRL REUS W/TWL LRG LVL3 (GOWN DISPOSABLE) ×2
GRASPER SUT TROCAR 14GX15 (MISCELLANEOUS) ×1 IMPLANT
IRRIG SUCT STRYKERFLOW 2 WTIP (MISCELLANEOUS) ×1
IRRIGATION SUCT STRKRFLW 2 WTP (MISCELLANEOUS) ×1 IMPLANT
KIT BASIN OR (CUSTOM PROCEDURE TRAY) ×1 IMPLANT
KIT TURNOVER KIT B (KITS) ×1 IMPLANT
NDL 22X1.5 STRL (OR ONLY) (MISCELLANEOUS) ×1 IMPLANT
NEEDLE 22X1.5 STRL (OR ONLY) (MISCELLANEOUS) ×1 IMPLANT
NS IRRIG 1000ML POUR BTL (IV SOLUTION) ×1 IMPLANT
PAD ARMBOARD 7.5X6 YLW CONV (MISCELLANEOUS) ×2 IMPLANT
POUCH RETRIEVAL ECOSAC 10 (ENDOMECHANICALS) ×1 IMPLANT
RELOAD STAPLE 45 3.5 BLU ETS (ENDOMECHANICALS) IMPLANT
RELOAD STAPLE TA45 3.5 REG BLU (ENDOMECHANICALS) ×1 IMPLANT
SCISSORS LAP 5X35 DISP (ENDOMECHANICALS) ×1 IMPLANT
SET TUBE SMOKE EVAC HIGH FLOW (TUBING) ×1 IMPLANT
SLEEVE Z-THREAD 5X100MM (TROCAR) ×1 IMPLANT
SPECIMEN JAR SMALL (MISCELLANEOUS) ×1 IMPLANT
SUT MNCRL AB 4-0 PS2 18 (SUTURE) ×1 IMPLANT
TOWEL GREEN STERILE (TOWEL DISPOSABLE) ×1 IMPLANT
TOWEL GREEN STERILE FF (TOWEL DISPOSABLE) ×1 IMPLANT
TRAY FOLEY W/BAG SLVR 14FR (SET/KITS/TRAYS/PACK) IMPLANT
TRAY LAPAROSCOPIC MC (CUSTOM PROCEDURE TRAY) ×1 IMPLANT
TROCAR Z THREAD OPTICAL 12X100 (TROCAR) ×1 IMPLANT
TROCAR Z-THREAD OPTICAL 5X100M (TROCAR) ×1 IMPLANT
WARMER LAPAROSCOPE (MISCELLANEOUS) ×1 IMPLANT
WATER STERILE IRR 1000ML POUR (IV SOLUTION) ×1 IMPLANT

## 2023-04-15 NOTE — ED Triage Notes (Signed)
Pt came to ED for RLQ abd pain that started Tuesday evening. Pt denies vomiting and diarrhea but has nausea. Axox4. VSS. Pts states last BM was this morning.

## 2023-04-15 NOTE — ED Provider Notes (Signed)
[facility]    CSN: [id number] Arrival date & time: 04/15/23  1135      History   Chief Complaint Chief Complaint  Patient presents with   Abdominal Pain    Entered by patient    HPI BRYAUNA Arellano is a 47 y.o. female.    Abdominal Pain Patient is here for abdominal pain  This started 2 days ago.  Generalized, but mostly in the lower abdomen.  Constant, but comes/goes in instensity.  Currently 6/10 in pain.  She took medication to have a BM, but that did not help.  No fever, but decreased appetite.  Some nausea but no vomiting.  No urinary symptoms noted.  She still has her appendix.       Past Medical History:  Diagnosis Date   High blood pressure     Patient Active Problem List   Diagnosis Date Noted   Annual physical exam 12/29/2022   Class 3 severe obesity due to excess calories without serious comorbidity with body mass index (BMI) of 45.0 to 49.9 in adult (HCC) 12/29/2022   Elevated cholesterol 05/25/2022   S/P abdominal hysterectomy 04/07/2022   Intramural and subserous leiomyoma of uterus    Menorrhagia with regular cycle 03/13/2022   Fibroids 01/01/2022   Morbid obesity (HCC) 09/04/2019   Iron deficiency anemia due to chronic blood loss 11/01/2015   Essential hypertension 02/23/2014    Past Surgical History:  Procedure Laterality Date   ABDOMINAL HYSTERECTOMY     CYST EXCISION     wrist   HYSTERECTOMY ABDOMINAL WITH SALPINGECTOMY Bilateral 04/07/2022   Procedure: HYSTERECTOMY ABDOMINAL WITH BILATERAL SALPINGECTOMY;  Surgeon: Warden Fillers, MD;  Location: [facility] OR;  Service: Gynecology;  Laterality: Bilateral;  TAP BLOCK   TONSILECTOMY/ADENOIDECTOMY WITH MYRINGOTOMY      OB History     Gravida  0   Para  0   Term  0   Preterm  0   AB  0   Living  0      SAB  0   IAB  0   Ectopic  0   Multiple  0   Live Births  0            Home Medications    Prior to Admission medications   Medication Sig Start Date  End Date Taking? Authorizing Provider  amLODipine (NORVASC) 10 MG tablet TAKE 1 TABLET BY MOUTH DAILY 09/24/22   Arnette Felts, FNP  ferrous sulfate 325 (65 FE) MG tablet Take 325 mg by mouth daily with breakfast.    [provider]    Family History Family History  Problem Relation Age of Onset   High blood pressure Mother    Hypertension Mother    Diabetes Father    High blood pressure Father    Cancer Father        prostate    Colon cancer Neg Hx    Esophageal cancer Neg Hx    Rectal cancer Neg Hx    Stomach cancer Neg Hx    Colon polyps Neg Hx     Social History Social History   Tobacco Use   Smoking status: Never   Smokeless tobacco: Never  Vaping Use   Vaping status: Never Used  Substance Use Topics   Alcohol use: Not Currently    Comment: More like once a month   Drug use: Never     Allergies   Patient has no known allergies.   Review of  Systems Review of Systems  Constitutional:  Positive for appetite change.  HENT: Negative.    Respiratory: Negative.    Cardiovascular: Negative.   Gastrointestinal:  Positive for abdominal pain.  Musculoskeletal: Negative.   Psychiatric/Behavioral: Negative.       Physical Exam Triage Vital Signs ED Triage Vitals  Encounter Vitals Group     BP 04/15/23 1201 104/73     Systolic BP Percentile --      Diastolic BP Percentile --      Pulse Rate 04/15/23 1201 (!) 107     Resp 04/15/23 1201 18     Temp 04/15/23 1201 99.8 F (37.7 C)     Temp Source 04/15/23 1201 Oral     SpO2 04/15/23 1201 98 %     Weight --      Height --      Head Circumference --      Peak Flow --      Pain Score 04/15/23 1203 6     Pain Loc --      Pain Education --      Exclude from Growth Chart --    No data found.  Updated Vital Signs BP 104/73 (BP Location: Left Wrist)   Pulse (!) 107   Temp 99.8 F (37.7 C) (Oral)   Resp 18   LMP 03/07/2022   SpO2 98%   Visual Acuity Right Eye Distance:   Left Eye Distance:    Bilateral Distance:    Right Eye Near:   Left Eye Near:    Bilateral Near:     Physical Exam Constitutional:      Appearance: She is ill-appearing.  Cardiovascular:     Rate and Rhythm: Normal rate and regular rhythm.  Pulmonary:     Effort: Pulmonary effort is normal.     Breath sounds: Normal breath sounds.  Abdominal:     General: Bowel sounds are decreased.     Palpations: Abdomen is soft.     Tenderness: There is generalized abdominal tenderness and tenderness in the right lower quadrant.  Neurological:     Mental Status: She is alert.      UC Treatments / Results  Labs (all labs ordered are listed, but only abnormal results are displayed) Labs Reviewed - No data to display  EKG   Radiology No results found.  Procedures Procedures (including critical care time)  Medications Ordered in UC Medications - No data to display  Initial Impression / Assessment and Plan / UC Course  I have reviewed the triage vital signs and the nursing notes.  Pertinent labs & imaging results that were available during my care of the patient were reviewed by me and considered in my medical decision making (see chart for details).  Final Clinical Impressions(s) / UC Diagnoses   Final diagnoses:  Generalized abdominal pain     Discharge Instructions      You were seen today for abdominal pain.   I recommend you go to the ER for further evaluation.     ED Prescriptions   None    PDMP not reviewed this encounter.   Jannifer Franklin, MD 04/15/23 1216

## 2023-04-15 NOTE — Anesthesia Preprocedure Evaluation (Signed)
Anesthesia Evaluation  Patient identified by MRN, date of birth, ID band Patient awake    Reviewed: Allergy & Precautions, NPO status , Patient's Chart, lab work & pertinent test results  Airway Mallampati: IV  TM Distance: >3 FB Neck ROM: Full  Mouth opening: Limited Mouth Opening  Dental  (+) Teeth Intact, Dental Advisory Given   Pulmonary neg pulmonary ROS   breath sounds clear to auscultation       Cardiovascular hypertension, Pt. on medications  Rhythm:Regular Rate:Normal     Neuro/Psych negative neurological ROS  negative psych ROS   GI/Hepatic negative GI ROS, Neg liver ROS,,,  Endo/Other  negative endocrine ROS    Renal/GU negative Renal ROS     Musculoskeletal negative musculoskeletal ROS (+)    Abdominal   Peds  Hematology  (+) Blood dyscrasia, anemia   Anesthesia Other Findings   Reproductive/Obstetrics                             Anesthesia Physical Anesthesia Plan  ASA: 3 and emergent  Anesthesia Plan: General   Post-op Pain Management: Tylenol PO (pre-op)* and Toradol IV (intra-op)*   Induction: Intravenous  PONV Risk Score and Plan: 4 or greater and Ondansetron, Dexamethasone, Midazolam and Scopolamine patch - Pre-op  Airway Management Planned: Oral ETT  Additional Equipment: None  Intra-op Plan:   Post-operative Plan: Extubation in OR  Informed Consent: I have reviewed the patients History and Physical, chart, labs and discussed the procedure including the risks, benefits and alternatives for the proposed anesthesia with the patient or authorized representative who has indicated his/her understanding and acceptance.     Dental advisory given  Plan Discussed with: CRNA  Anesthesia Plan Comments:        Anesthesia Quick Evaluation

## 2023-04-15 NOTE — Anesthesia Postprocedure Evaluation (Signed)
Anesthesia Post Note  Patient: Katherine Arellano  Procedure(s) Performed: APPENDECTOMY LAPAROSCOPIC     Patient location during evaluation: PACU Anesthesia Type: General Level of consciousness: awake and alert Pain management: pain level controlled Vital Signs Assessment: post-procedure vital signs reviewed and stable Respiratory status: spontaneous breathing, nonlabored ventilation, respiratory function stable and patient connected to nasal cannula oxygen Cardiovascular status: blood pressure returned to baseline and stable Postop Assessment: no apparent nausea or vomiting Anesthetic complications: no  No notable events documented.  Last Vitals:  Vitals:   04/15/23 2300 04/15/23 2315  BP: 126/72 125/79  Pulse: 96 97  Resp: 17 18  Temp: 37 C 37.3 C  SpO2: 95% 100%    Last Pain:  Vitals:   04/15/23 2315  TempSrc: Oral  PainSc:                  [name]

## 2023-04-15 NOTE — Anesthesia Procedure Notes (Signed)
Procedure Name: Intubation Date/Time: 04/15/2023 9:23 PM  Performed by: Sandie Ano, CRNAPre-anesthesia Checklist: Patient identified, Emergency Drugs available, Suction available and Patient being monitored Patient Re-evaluated:Patient Re-evaluated prior to induction Oxygen Delivery Method: Circle System Utilized Preoxygenation: Pre-oxygenation with 100% oxygen Induction Type: IV induction Ventilation: Mask ventilation without difficulty Laryngoscope Size: Mac and 3 Grade View: Grade IV Tube type: Oral Number of attempts: 1 Airway Equipment and Method: Stylet and Oral airway Placement Confirmation: ETT inserted through vocal cords under direct vision, positive ETCO2 and breath sounds checked- equal and bilateral Secured at: 22 cm Tube secured with: Tape Dental Injury: Teeth and Oropharynx as per pre-operative assessment  Difficulty Due To: Difficult Airway- due to limited oral opening Future Recommendations: Recommend- induction with short-acting agent, and alternative techniques readily available

## 2023-04-15 NOTE — Discharge Instructions (Signed)
You were seen today for abdominal pain.   I recommend you go to the ER for further evaluation.

## 2023-04-15 NOTE — ED Notes (Signed)
Patient is being discharged from the Urgent Care and sent to the Emergency Department via pov . Per Dr. Haynes Bast, patient is in need of higher level of care due to abdominal pain. Patient is aware and verbalizes understanding of plan of care.  Vitals:   04/15/23 1201  BP: 104/73  Pulse: (!) 107  Resp: 18  Temp: 99.8 F (37.7 C)  SpO2: 98%

## 2023-04-15 NOTE — H&P (Signed)
Reason for Consult:abdominal pain Referring Provider: Velvet Bathe  Katherine Arellano is an 47 y.o. female.  HPI: 47 yo female with 1 day of abdominal pain. Pain is in her right lower area. It is sharp and constant. She has nausea but no vomiting.  Past Medical History:  Diagnosis Date   High blood pressure     Past Surgical History:  Procedure Laterality Date   ABDOMINAL HYSTERECTOMY     CYST EXCISION     wrist   HYSTERECTOMY ABDOMINAL WITH SALPINGECTOMY Bilateral 04/07/2022   Procedure: HYSTERECTOMY ABDOMINAL WITH BILATERAL SALPINGECTOMY;  Surgeon: Warden Fillers, MD;  Location: [facility] OR;  Service: Gynecology;  Laterality: Bilateral;  TAP BLOCK   TONSILECTOMY/ADENOIDECTOMY WITH MYRINGOTOMY      Family History  Problem Relation Age of Onset   High blood pressure Mother    Hypertension Mother    Diabetes Father    High blood pressure Father    Cancer Father        prostate    Colon cancer Neg Hx    Esophageal cancer Neg Hx    Rectal cancer Neg Hx    Stomach cancer Neg Hx    Colon polyps Neg Hx     Social History:  reports that she has never smoked. She has never used smokeless tobacco. She reports that she does not currently use alcohol. She reports that she does not use drugs.  Allergies: No Known Allergies  Medications: I have reviewed the patient's current medications.  Results for orders placed or performed during the hospital encounter of 04/15/23 (from the past 48 hour(s))  Urinalysis, Routine w reflex microscopic -Urine, Clean Catch     Status: Abnormal   Collection Time: 04/15/23 12:50 PM  Result Value Ref Range   Color, Urine AMBER (A) YELLOW    Comment: BIOCHEMICALS MAY BE AFFECTED BY COLOR   APPearance HAZY (A) CLEAR   Specific Gravity, Urine 1.026 1.005 - 1.030   pH 5.0 5.0 - 8.0   Glucose, UA NEGATIVE NEGATIVE mg/dL   Hgb urine dipstick NEGATIVE NEGATIVE   Bilirubin Urine NEGATIVE NEGATIVE   Ketones, ur 80 (A) NEGATIVE mg/dL   Protein, ur 308  (A) NEGATIVE mg/dL   Nitrite NEGATIVE NEGATIVE   Leukocytes,Ua NEGATIVE NEGATIVE   RBC / HPF 0-5 0 - 5 RBC/hpf   WBC, UA 0-5 0 - 5 WBC/hpf   Bacteria, UA RARE (A) NONE SEEN   Squamous Epithelial / HPF 0-5 0 - 5 /HPF   Mucus PRESENT     Comment: Performed at [facility], [address] [place], [place] [address]  Lipase, blood     Status: None   Collection Time: 04/15/23 12:57 PM  Result Value Ref Range   Lipase 29 11 - 51 U/L    Comment: Performed at [facility], [address], [place], [place] [address]  Comprehensive metabolic panel     Status: Abnormal   Collection Time: 04/15/23 12:57 PM  Result Value Ref Range   Sodium 134 (L) 135 - 145 mmol/L   Potassium 3.8 3.5 - 5.1 mmol/L   Chloride 104 98 - 111 mmol/L   CO2 22 22 - 32 mmol/L   Glucose, Bld 102 (H) 70 - 99 mg/dL    Comment: Glucose reference range applies only to samples taken after fasting for at least 8 hours.   BUN 8 6 - 20 mg/dL   Creatinine, Ser 5.28 0.44 - 1.00 mg/dL   Calcium  9.0 8.9 - 10.3 mg/dL   Total Protein 7.6 6.5 - 8.1 g/dL   Albumin 3.8 3.5 - 5.0 g/dL   AST 12 (L) 15 - 41 U/L   ALT 9 0 - 44 U/L   Alkaline Phosphatase 51 38 - 126 U/L   Total Bilirubin 1.1 0.3 - 1.2 mg/dL   GFR, Estimated >40 >98 mL/min    Comment: (NOTE) Calculated using the CKD-EPI Creatinine Equation (2021)    Anion gap 8 5 - 15    Comment: Performed at [facility], [address] [place], [place] [address]  CBC     Status: Abnormal   Collection Time: 04/15/23 12:57 PM  Result Value Ref Range   WBC 17.5 (H) 4.0 - 10.5 K/uL   RBC 4.66 3.87 - 5.11 MIL/uL   Hemoglobin 13.4 12.0 - 15.0 g/dL   HCT 78.2 95.6 - 21.3 %   MCV 86.3 80.0 - 100.0 fL   MCH 28.8 26.0 - 34.0 pg   MCHC 33.3 30.0 - 36.0 g/dL   RDW 08.6 57.8 - 46.9 %   Platelets 355 150 - 400 K/uL   nRBC 0.0 0.0 - 0.2 %    Comment: Performed at [facility], [address], [place], [place] [address]    CT ABDOMEN PELVIS W CONTRAST  Result  Date: 04/15/2023 CLINICAL DATA:  RLQ pain. EXAM: CT ABDOMEN AND PELVIS WITH CONTRAST TECHNIQUE: Multidetector CT imaging of the abdomen and pelvis was performed using the standard protocol following bolus administration of intravenous contrast. RADIATION DOSE REDUCTION: This exam was performed according to the departmental dose-optimization program which includes automated exposure control, adjustment of the mA and/or kV according to patient size and/or use of iterative reconstruction technique. CONTRAST:  75mL OMNIPAQUE IOHEXOL 350 MG/ML SOLN COMPARISON:  None Available. FINDINGS: Lower chest: Lung bases clear.  No pleural or pericardial effusion. Hepatobiliary: No focal liver abnormality is seen. No gallstones, gallbladder wall thickening, or biliary dilatation. Pancreas: Unremarkable. No pancreatic ductal dilatation or surrounding inflammatory changes. Spleen: Normal in size without focal abnormality. Adrenals/Urinary Tract: Adrenal glands are unremarkable. Kidneys are normal, without renal calculi, focal lesion, or hydronephrosis. Bladder is unremarkable. Stomach/Bowel: No bowel dilatation to suggest obstruction. Unremarkable appearance of the stomach. Appendix is distended with periappendiceal fat stranding and appendicoliths. Diverticula in the sigmoid. Vascular/Lymphatic: No significant vascular findings are present. No enlarged abdominal or pelvic lymph nodes. Reproductive: Cystic adnexal changes bilaterally consistent with normal follicular activity. Differential would include abscesses related to the acute appendicitis. The largest is on the left measuring 4 cm. status post hysterectomy. Other: No abdominal wall hernia or abnormality. No abdominopelvic ascites. Musculoskeletal: Lower thoracic and thoracolumbar degenerative changes. IMPRESSION: 1. Acute appendicitis with periappendiceal inflammatory changes. 2. Pelvic cystic changes that are likely ovarian follicles although abscesses related to the nearby  abnormal appendix cannot be excluded. Pelvic ultrasound correlation might be able to determine whether the cystic changes in the adnexae are ovarian versus abscesses related to appendicitis. Findings discussed with and acknowledged by Dr. Wallace Cullens. Electronically Signed   By: [name] Maw M.D.   On: 04/15/2023 18:39    ROS  PE Blood pressure 137/77, pulse 92, temperature 100.3 F (37.9 C), temperature source Oral, resp. rate 16, height 5\' 6"  (1.676 m), weight 124.7 kg, last menstrual period 03/07/2022, SpO2 100%. Constitutional: NAD; conversant; no deformities Eyes: Moist conjunctiva; no lid lag; anicteric; PERRL Neck: Trachea midline; no thyromegaly Lungs: Normal respiratory effort; no tactile fremitus CV: RRR; no palpable  thrills; no pitting edema GI: Abd tender to palpation RLQ; no palpable hepatosplenomegaly MSK: Normal gait; no clubbing/cyanosis Psychiatric: Appropriate affect; alert and oriented x3 Lymphatic: No palpable cervical or axillary lymphadenopathy Skin: No major subcutaneous nodules. Warm and dry   Assessment/Plan: 47 yo female with acute appendicitis. Ct images reviewed showing fecalith and large appendix with inflammatory changes. WBC 16 consistent with infection -IV abx -OR for lap appendectomy -we discussed the details of the procedure; that it would be done under general anesthesia, that we would attempt to do the procedure laparoscopically. That the appendix would be isolated from the large and small intestine and then ligated and removed. We discussed the reason for this is to avoid rupture and infection and resolve the pains. We discussed risks of infection, abscess, injury to intestines or urinary structures, and need for open incision. She showed good understanding and wanted to proceed.   I reviewed last 24 h vitals and pain scores, last 48 h intake and output, last 24 h labs and trends, and last 24 h imaging results.  This care required high  level of medical  decision making.   De Blanch Mariana Goytia 04/15/2023, 8:12 PM

## 2023-04-15 NOTE — ED Triage Notes (Addendum)
C/o abd pain since Tuesday evening- took exlax yesterday and had small BM today. C/o nausea, lower abd pain and bloating. Pain is in lower abdomen, worse on the right. Poor appetite

## 2023-04-15 NOTE — ED Provider Notes (Signed)
[place] EMERGENCY DEPARTMENT AT [facility] Provider Note   CSN: [id number] Arrival date & time: 04/15/23  1241     History  Chief Complaint  Patient presents with   Abdominal Pain    Katherine Arellano is a 47 y.o. female with past medical history for obesity, hypertension, previous hysterectomy secondary to fibroids, menorrhagia presents with concern for severe right lower quadrant abdominal pain that started Tuesday evening.  She reports it was initially mild but has since worsened.  Even over the course of her visit today she reports it has gone from 6 out of 10 to an 8 out of 10 at rest.  She denies diarrhea, constipation but does endorse some nausea without vomiting.  She endorses lack of appetite since the pain began.  She not taken thing for pain prior to arrival.  She reports she still has her appendix.  She reports she has had some intermittent chills but is unsure whether was because of the change in the weather.   Abdominal Pain Associated symptoms: nausea        Home Medications Prior to Admission medications   Medication Sig Start Date End Date Taking? Authorizing Provider  amLODipine (NORVASC) 10 MG tablet TAKE 1 TABLET BY MOUTH DAILY 09/24/22   Arnette Felts, FNP  ferrous sulfate 325 (65 FE) MG tablet Take 325 mg by mouth daily with breakfast.    [provider]      Allergies    Patient has no known allergies.    Review of Systems   Review of Systems  Gastrointestinal:  Positive for abdominal pain and nausea.  All other systems reviewed and are negative.   Physical Exam Updated Vital Signs BP 135/72 (BP Location: Right Arm)   Pulse 90   Temp 99.5 F (37.5 C) (Oral)   Resp 17   Ht 5\' 6"  (1.676 m)   Wt 124.7 kg   LMP 03/07/2022   SpO2 100%   BMI 44.39 kg/m  Physical Exam Vitals and nursing note reviewed.  Constitutional:      General: She is not in acute distress.    Appearance: Normal appearance.  HENT:     Head:  Normocephalic and atraumatic.  Eyes:     General:        Right eye: No discharge.        Left eye: No discharge.  Cardiovascular:     Rate and Rhythm: Normal rate and regular rhythm.     Heart sounds: No murmur heard.    No friction rub. No gallop.  Pulmonary:     Effort: Pulmonary effort is normal.     Breath sounds: Normal breath sounds.  Abdominal:     General: Bowel sounds are normal.     Palpations: Abdomen is soft.     Comments: Patient with focal tenderness to palpation in the right lower quadrant with some guarding  Skin:    General: Skin is warm and dry.     Capillary Refill: Capillary refill takes less than 2 seconds.  Neurological:     Mental Status: She is alert and oriented to person, place, and time.  Psychiatric:        Mood and Affect: Mood normal.        Behavior: Behavior normal.     ED Results / Procedures / Treatments   Labs (all labs ordered are listed, but only abnormal results are displayed) Labs Reviewed  COMPREHENSIVE METABOLIC PANEL - Abnormal; Notable for the following  components:      Result Value   Sodium 134 (*)    Glucose, Bld 102 (*)    AST 12 (*)    All other components within normal limits  CBC - Abnormal; Notable for the following components:   WBC 17.5 (*)    All other components within normal limits  URINALYSIS, ROUTINE W REFLEX MICROSCOPIC - Abnormal; Notable for the following components:   Color, Urine AMBER (*)    APPearance HAZY (*)    Ketones, ur 80 (*)    Protein, ur 100 (*)    Bacteria, UA RARE (*)    All other components within normal limits  LIPASE, BLOOD    EKG None  Radiology CT ABDOMEN PELVIS W CONTRAST  Result Date: 04/15/2023 CLINICAL DATA:  RLQ pain. EXAM: CT ABDOMEN AND PELVIS WITH CONTRAST TECHNIQUE: Multidetector CT imaging of the abdomen and pelvis was performed using the standard protocol following bolus administration of intravenous contrast. RADIATION DOSE REDUCTION: This exam was performed according  to the departmental dose-optimization program which includes automated exposure control, adjustment of the mA and/or kV according to patient size and/or use of iterative reconstruction technique. CONTRAST:  75mL OMNIPAQUE IOHEXOL 350 MG/ML SOLN COMPARISON:  None Available. FINDINGS: Lower chest: Lung bases clear.  No pleural or pericardial effusion. Hepatobiliary: No focal liver abnormality is seen. No gallstones, gallbladder wall thickening, or biliary dilatation. Pancreas: Unremarkable. No pancreatic ductal dilatation or surrounding inflammatory changes. Spleen: Normal in size without focal abnormality. Adrenals/Urinary Tract: Adrenal glands are unremarkable. Kidneys are normal, without renal calculi, focal lesion, or hydronephrosis. Bladder is unremarkable. Stomach/Bowel: No bowel dilatation to suggest obstruction. Unremarkable appearance of the stomach. Appendix is distended with periappendiceal fat stranding and appendicoliths. Diverticula in the sigmoid. Vascular/Lymphatic: No significant vascular findings are present. No enlarged abdominal or pelvic lymph nodes. Reproductive: Cystic adnexal changes bilaterally consistent with normal follicular activity. Differential would include abscesses related to the acute appendicitis. The largest is on the left measuring 4 cm. status post hysterectomy. Other: No abdominal wall hernia or abnormality. No abdominopelvic ascites. Musculoskeletal: Lower thoracic and thoracolumbar degenerative changes. IMPRESSION: 1. Acute appendicitis with periappendiceal inflammatory changes. 2. Pelvic cystic changes that are likely ovarian follicles although abscesses related to the nearby abnormal appendix cannot be excluded. Pelvic ultrasound correlation might be able to determine whether the cystic changes in the adnexae are ovarian versus abscesses related to appendicitis. Findings discussed with and acknowledged by Dr. Wallace Cullens. Electronically Signed   By: [name] Maw M.D.   On:  04/15/2023 18:39    Procedures Procedures    Medications Ordered in ED Medications  cefTRIAXone (ROCEPHIN) 2 g in sodium chloride 0.9 % 100 mL IVPB (has no administration in time range)    And  metroNIDAZOLE (FLAGYL) IVPB 500 mg (has no administration in time range)  morphine (PF) 4 MG/ML injection 4 mg (4 mg Intravenous Given 04/15/23 1638)  ondansetron (ZOFRAN) injection 4 mg (4 mg Intravenous Given 04/15/23 1638)  iohexol (OMNIPAQUE) 350 MG/ML injection 75 mL (75 mLs Intravenous Contrast Given 04/15/23 1749)    ED Course/ Medical Decision Making/ A&P                                 Medical Decision Making Amount and/or Complexity of Data Reviewed Radiology: ordered.  Risk Prescription drug management.   This patient is a 47 y.o. female who presents to the ED for concern of abdominal pain,  this involves an extensive number of treatment options, and is a complaint that carries with it a high risk of complications and morbidity. The emergent differential diagnosis prior to evaluation includes, but is not limited to,  The causes of generalized abdominal pain include but are not limited to AAA, mesenteric ischemia, appendicitis, diverticulitis, DKA, gastritis, gastroenteritis, AMI, nephrolithiasis, pancreatitis, peritonitis, adrenal insufficiency,lead poisoning, iron toxicity, intestinal ischemia, constipation, UTI,SBO/LBO, splenic rupture, biliary disease, IBD, IBS, PUD, or hepatitis. This is not an exhaustive differential.   Past Medical History / Co-morbidities / Social History: obesity, hypertension, previous hysterectomy secondary to fibroids, menorrhagia  Additional history: Chart reviewed. Pertinent results include: Reviewed remote lab work, imaging for hysterectomy, fibroids, outpatient OB/GYN visits  Physical Exam: Physical exam performed. The pertinent findings include:  Patient with focal tenderness to palpation in the right lower quadrant with some guarding   She is  developing elevated temperature, temp 99.5 on arrival.  Vital signs otherwise stable.  Lab Tests: I ordered, and personally interpreted labs.  The pertinent results include: Significant leukocytosis, white blood cell 17.5.  Her CMP is overall unremarkable, mild hyponatremia, sodium 134.  Lipase unremarkable.  UA with some ketones and protein which is consistent with decreased oral intake, mild dehydration.   Imaging Studies: I ordered imaging studies including CT abdomen pelvis with contrast. I independently visualized and interpreted imaging which showed acute appendicitis, question cystic changes versus possible abscess in the same region.  Suspect likely ovarian cystic changes however.. I agree with the radiologist interpretation.   Medications: I ordered medication including morphine, Zofran for nausea, pain, Rocephin, Flagyl for acute appendicitis  Consultations Obtained: I requested consultation with the surgeon, spoke with Dr. Sheliah Hatch, and discussed lab and imaging findings as well as pertinent plan - they recommend: surgery for acute appendicitis   Disposition: After consideration of the diagnostic results and the patients response to treatment, I feel that patient would benefit from admission for acute appendicitis .   I discussed this case with my attending physician Dr. Wallace Cullens who cosigned this note including patient's presenting symptoms, physical exam, and planned diagnostics and interventions. Attending physician stated agreement with plan or made changes to plan which were implemented.    Final Clinical Impression(s) / ED Diagnoses Final diagnoses:  Acute appendicitis, unspecified acute appendicitis type    Rx / DC Orders ED Discharge Orders     None         Olene Floss, PA-C 04/15/23 1902    Franne Forts, DO 04/16/23 [phone]

## 2023-04-15 NOTE — ED Notes (Signed)
Patient transported to CT 

## 2023-04-15 NOTE — Transfer of Care (Signed)
Immediate Anesthesia Transfer of Care Note  Patient: DWIGHT BURDO  Procedure(s) Performed: APPENDECTOMY LAPAROSCOPIC  Patient Location: PACU  Anesthesia Type:General  Level of Consciousness: awake and alert   Airway & Oxygen Therapy: Patient Spontanous Breathing and Patient connected to nasal cannula oxygen  Post-op Assessment: Report given to RN and Post -op Vital signs reviewed and stable  Post vital signs: Reviewed and stable  Last Vitals:  Vitals Value Taken Time  BP 122/64 04/15/23 2217  Temp    Pulse 103 04/15/23 2218  Resp 21 04/15/23 2218  SpO2 96 % 04/15/23 2218  Vitals shown include unfiled device data.  Last Pain:  Vitals:   04/15/23 2013  TempSrc:   PainSc: 6          Complications: No notable events documented.

## 2023-04-15 NOTE — Op Note (Signed)
Preoperative diagnosis: acute appendicitis with perforation  Postoperative diagnosis: Same   Procedure: laparoscopic appendectomy  Surgeon: Feliciana Rossetti, M.D.  Asst: none  Anesthesia: Gen.   Indications for procedure: Katherine Arellano is a 47 y.o. female with symptoms of pain in right lower quadrant and nausea consistent with acute appendicitis. Confirmed by CT.  Description of procedure: The patient was brought into the operative suite, placed supine. Anesthesia was administered with endotracheal tube. The patient's left arm was tucked. All pressure points were offloaded by foam padding. The patient was prepped and draped in the usual sterile fashion.  A transverse incision was made to the left of the umbilicus and a 5mm trocar was [place]. Pneumoperitoneum was applied with high flow low pressure.  2 5mm trocars were placed, one in the suprapubic space, one in the LLQ, the periumbilical incision was then up-sized and a 12mm trocar placed in that space. A transversus abdominal block was placed on the left and right sides. Next, the patient was placed in trendelenberg, rotated to the left. The omentum was retracted cephalad. The cecum and appendix were identified. The appendix had exudate over it and was gangrenous in appearance. There was a murky fluid in the pelvis that was removed with suction. The appendix was adhered to the ovaries and remaining fallopian tubes. Blunt dissection was used to free the appendix. The base of the appendix was dissected and a window through the mesoappendix was created with blunt dissection. A 45mm blue load stapler was used to cut the appendix at its base. Next hemolocks were placed across the mesoappendix and the mesoappendix was cut with cautery.  The appendix was placed in a specimen bag. The pelvis and RLQ were irrigated. The appendix was removed via the 12 mm trocar. 0 vicryl was used to close the fascial defect. Pneumoperitoneum was removed, all trocars were  removed. All incisions were closed with 4-0 monocryl subcuticular stitch. The patient woke from anesthesia and was brought to PACU in stable condition.  Findings: gangrenous appendix with concern for perforation  Specimen: appendix  Blood loss: 30 ml  Local anesthesia: 20 ml Marcaine  Complications: noen   Feliciana Rossetti, M.D. General, Bariatric, & Minimally Invasive Surgery [facility]

## 2023-04-16 ENCOUNTER — Encounter (HOSPITAL_COMMUNITY): Payer: Self-pay | Admitting: General Surgery

## 2023-04-16 LAB — CBC
HCT: 34.9 % — ABNORMAL LOW (ref 36.0–46.0)
Hemoglobin: 11.8 g/dL — ABNORMAL LOW (ref 12.0–15.0)
MCH: 29.6 pg (ref 26.0–34.0)
MCHC: 33.8 g/dL (ref 30.0–36.0)
MCV: 87.7 fL (ref 80.0–100.0)
Platelets: 320 10*3/uL (ref 150–400)
RBC: 3.98 MIL/uL (ref 3.87–5.11)
RDW: 13.2 % (ref 11.5–15.5)
WBC: 17.6 10*3/uL — ABNORMAL HIGH (ref 4.0–10.5)
nRBC: 0 % (ref 0.0–0.2)

## 2023-04-16 LAB — BASIC METABOLIC PANEL
Anion gap: 11 (ref 5–15)
BUN: 12 mg/dL (ref 6–20)
CO2: 21 mmol/L — ABNORMAL LOW (ref 22–32)
Calcium: 8.5 mg/dL — ABNORMAL LOW (ref 8.9–10.3)
Chloride: 99 mmol/L (ref 98–111)
Creatinine, Ser: 1.04 mg/dL — ABNORMAL HIGH (ref 0.44–1.00)
GFR, Estimated: >60 mL/min (ref >60)
Glucose, Bld: 120 mg/dL — ABNORMAL HIGH (ref 70–99)
Potassium: 4 mmol/L (ref 3.5–5.1)
Sodium: 131 mmol/L — ABNORMAL LOW (ref 135–145)

## 2023-04-16 MED ORDER — AMOXICILLIN-POT CLAVULANATE 875-125 MG PO TABS
1.0000 | ORAL_TABLET | Freq: Two times a day (BID) | ORAL | Status: DC
  Administered 2023-04-16 – 2023-04-17 (×3): 1 via ORAL
  Filled 2023-04-16 (×3): qty 1

## 2023-04-16 NOTE — Progress Notes (Signed)
   04/16/23 1549  TOC Brief Assessment  Insurance and Status Reviewed Advertising copywriter)  Patient has primary care physician Yes Christell Constant, [place], FNP)  Home environment has been reviewed from home  Prior level of function: independent  Prior/Current Home Services No current home services  Social Determinants of Health Reivew SDOH reviewed no interventions necessary  Readmission risk has been reviewed Yes (8%)  Transition of care needs no transition of care needs at this time   [facility] will continue to follow patient for any discharge needs

## 2023-04-16 NOTE — Plan of Care (Signed)
  Problem: Education: Goal: Understanding of post-operative needs will improve Outcome: Progressing Goal: Individualized Educational Video(s) Outcome: Progressing   Problem: Clinical Measurements: Goal: Postoperative complications will be avoided or minimized Outcome: Progressing   Problem: Respiratory: Goal: Will regain and/or maintain adequate ventilation Outcome: Progressing   Problem: Education: Goal: Knowledge of General Education information will improve Description: Including pain rating scale, medication(s)/side effects and non-pharmacologic comfort measures Outcome: Progressing   Problem: Health Behavior/Discharge Planning: Goal: Ability to manage health-related needs will improve Outcome: Progressing   Problem: Clinical Measurements: Goal: Ability to maintain clinical measurements within normal limits will improve Outcome: Progressing Goal: Will remain free from infection Outcome: Progressing Goal: Diagnostic test results will improve Outcome: Progressing Goal: Respiratory complications will improve Outcome: Progressing Goal: Cardiovascular complication will be avoided Outcome: Progressing   Problem: Activity: Goal: Risk for activity intolerance will decrease Outcome: Progressing   Problem: Nutrition: Goal: Adequate nutrition will be maintained Outcome: Progressing   Problem: Coping: Goal: Level of anxiety will decrease Outcome: Progressing   Problem: Elimination: Goal: Will not experience complications related to bowel motility Outcome: Progressing Goal: Will not experience complications related to urinary retention Outcome: Progressing   Problem: Pain Managment: Goal: General experience of comfort will improve Outcome: Progressing   Problem: Safety: Goal: Ability to remain free from injury will improve Outcome: Progressing   Problem: Skin Integrity: Goal: Risk for impaired skin integrity will decrease Outcome: Progressing

## 2023-04-16 NOTE — Discharge Instructions (Signed)

## 2023-04-16 NOTE — Progress Notes (Signed)
1 Day Post-Op  Subjective: Reports that her pain is much better. Tolerating PO. Denies complaints.   ROS: See above, otherwise other systems negative  Objective: Vital signs in last 24 hours: Temp:  [98.2 F (36.8 C)-100.3 F (37.9 C)] 98.5 F (36.9 C) (10/18 0437) Pulse Rate:  [90-115] 90 (10/18 0437) Resp:  [15-24] 24 (10/18 0437) BP: (104-137)/(64-79) 123/70 (10/18 0437) SpO2:  [92 %-100 %] 98 % (10/18 0437) Weight:  [124.7 kg] 124.7 kg (10/17 1248) Last BM Date : 04/15/23  Intake/Output from previous day: 10/17 0701 - 10/18 0700 In: 1000 [I.V.:900; IV Piggyback:100] Out: 25 [Blood:25] Intake/Output this shift: No intake/output data recorded.  PE: Gen: female resting in bed, NAD Abd: soft, non-distended, port sites clean and dry with dermabond intact  Lab Results:  Recent Labs    04/15/23 1257  WBC 17.5*  HGB 13.4  HCT 40.2  PLT 355   BMET Recent Labs    04/15/23 1257  NA 134*  K 3.8  CL 104  CO2 22  GLUCOSE 102*  BUN 8  CREATININE 0.93  CALCIUM 9.0   PT/INR No results for input(s): "LABPROT", "INR" in the last 72 hours. CMP     Component Value Date/Time   NA 134 (L) 04/15/2023 1257   NA 138 12/29/2022 1630   K 3.8 04/15/2023 1257   CL 104 04/15/2023 1257   CO2 22 04/15/2023 1257   GLUCOSE 102 (H) 04/15/2023 1257   BUN 8 04/15/2023 1257   BUN 10 12/29/2022 1630   CREATININE 0.93 04/15/2023 1257   CALCIUM 9.0 04/15/2023 1257   PROT 7.6 04/15/2023 1257   PROT 6.9 12/29/2022 1630   ALBUMIN 3.8 04/15/2023 1257   ALBUMIN 4.4 12/29/2022 1630   AST 12 (L) 04/15/2023 1257   ALT 9 04/15/2023 1257   ALKPHOS 51 04/15/2023 1257   BILITOT 1.1 04/15/2023 1257   BILITOT 0.5 12/29/2022 1630   GFRNONAA >60 04/15/2023 1257   GFRAA 88 07/11/2020 1257   Lipase     Component Value Date/Time   LIPASE 29 04/15/2023 1257    Studies/Results: CT ABDOMEN PELVIS W CONTRAST  Result Date: 04/15/2023 CLINICAL DATA:  RLQ pain. EXAM: CT ABDOMEN AND  PELVIS WITH CONTRAST TECHNIQUE: Multidetector CT imaging of the abdomen and pelvis was performed using the standard protocol following bolus administration of intravenous contrast. RADIATION DOSE REDUCTION: This exam was performed according to the departmental dose-optimization program which includes automated exposure control, adjustment of the mA and/or kV according to patient size and/or use of iterative reconstruction technique. CONTRAST:  75mL OMNIPAQUE IOHEXOL 350 MG/ML SOLN COMPARISON:  None Available. FINDINGS: Lower chest: Lung bases clear.  No pleural or pericardial effusion. Hepatobiliary: No focal liver abnormality is seen. No gallstones, gallbladder wall thickening, or biliary dilatation. Pancreas: Unremarkable. No pancreatic ductal dilatation or surrounding inflammatory changes. Spleen: Normal in size without focal abnormality. Adrenals/Urinary Tract: Adrenal glands are unremarkable. Kidneys are normal, without renal calculi, focal lesion, or hydronephrosis. Bladder is unremarkable. Stomach/Bowel: No bowel dilatation to suggest obstruction. Unremarkable appearance of the stomach. Appendix is distended with periappendiceal fat stranding and appendicoliths. Diverticula in the sigmoid. Vascular/Lymphatic: No significant vascular findings are present. No enlarged abdominal or pelvic lymph nodes. Reproductive: Cystic adnexal changes bilaterally consistent with normal follicular activity. Differential would include abscesses related to the acute appendicitis. The largest is on the left measuring 4 cm. status post hysterectomy. Other: No abdominal wall hernia or abnormality. No abdominopelvic ascites. Musculoskeletal: Lower thoracic and thoracolumbar degenerative  changes. IMPRESSION: 1. Acute appendicitis with periappendiceal inflammatory changes. 2. Pelvic cystic changes that are likely ovarian follicles although abscesses related to the nearby abnormal appendix cannot be excluded. Pelvic ultrasound  correlation might be able to determine whether the cystic changes in the adnexae are ovarian versus abscesses related to appendicitis. Findings discussed with and acknowledged by Dr. Wallace Cullens. Electronically Signed   By: [name] Maw M.D.   On: 04/15/2023 18:39    Anti-infectives: Anti-infectives (From admission, onward)    Start     Dose/Rate Route Frequency Ordered Stop   04/16/23 1800  cefTRIAXone (ROCEPHIN) 2 g in sodium chloride 0.9 % 100 mL IVPB       Placed in "And" Linked Group   2 g 200 mL/hr over 30 Minutes Intravenous Every 24 hours 04/15/23 2314 04/21/23 1759   04/16/23 1000  metroNIDAZOLE (FLAGYL) IVPB 500 mg       Placed in "And" Linked Group   500 mg 100 mL/hr over 60 Minutes Intravenous Every 12 hours 04/15/23 2314 04/21/23 0959   04/15/23 1845  cefTRIAXone (ROCEPHIN) 2 g in sodium chloride 0.9 % 100 mL IVPB       Placed in "And" Linked Group   2 g 200 mL/hr over 30 Minutes Intravenous  Once 04/15/23 1835 04/15/23 1954   04/15/23 1845  metroNIDAZOLE (FLAGYL) IVPB 500 mg       Placed in "And" Linked Group   500 mg 100 mL/hr over 60 Minutes Intravenous  Once 04/15/23 1835 04/15/23 2112       Assessment/Plan 47 y/o F POD 1 from lap appy w/ intra-operative findings showing gangrenous appendix with possible perforation   FEN - Regular diet VTE - Lovenox ID - Transition to Augmentin for planned 4 day course Dispo - Tentatively home tomorrow  I reviewed last 24 h vitals and pain scores, last 24 h labs and trends, and last 24 h imaging results.   LOS: 1 day   [facility] 04/16/2023, 7:55 AM Please see Amion for pager number during day hours 7:00am-4:30pm or 7:00am -11:30am on weekends

## 2023-04-17 MED ORDER — ACETAMINOPHEN 500 MG PO TABS: 1000.0000 mg | ORAL_TABLET | Freq: Four times a day (QID) | ORAL | Status: DC | PRN

## 2023-04-17 MED ORDER — AMOXICILLIN-POT CLAVULANATE 875-125 MG PO TABS: 1.0000 | ORAL_TABLET | Freq: Two times a day (BID) | ORAL | 0 refills | Status: AC

## 2023-04-17 MED ORDER — OXYCODONE HCL 5 MG PO TABS: 5.0000 mg | ORAL_TABLET | ORAL | 0 refills | Status: DC | PRN

## 2023-04-17 NOTE — Discharge Summary (Signed)
Physician Discharge Summary  Patient ID: Katherine Arellano MRN: [id number] DOB/AGE: August 13, 1975 47 y.o.  Admit date: 04/15/2023 Discharge date: 04/17/2023  Admission Diagnoses: Acute appendicitis   Discharge Diagnoses:  Principal Problem:   Appendicitis Active Problems:   Perforated appendicitis   Discharged Condition: stable  Hospital Course: 47 y/o F who presented to the ED with abdominal pain. CT and exam were consistent with acute appendicitis. She was taken to the OR on 10/17 for a laparoscopic appendectomy.  Intra-operative findings were notable for a gangrenous appendix with possible perforation.  She tolerated the procedure well and there were no immediate complications.  She remained in patient for observation but was discharged on POD 2.  At discharge she was tolerating PO, her pain was well controlled, and she was ambulating at baseline.   Treatments:  Lap appy (10/17, Dr. Sheliah Hatch)  Discharge Exam: Blood pressure 119/70, pulse 88, temperature 98.2 F (36.8 C), temperature source Oral, resp. rate 18, height 5\' 6"  (1.676 m), weight 124.7 kg, last menstrual period 03/07/2022, SpO2 99%. General appearance: alert, cooperative, and appears stated age GI: soft, non-distended, non-tender, port sites clean and dry with dermabond intact  Disposition:    Allergies as of 04/17/2023   No Known Allergies      Medication List     TAKE these medications    acetaminophen 500 MG tablet Commonly known as: TYLENOL Take 2 tablets (1,000 mg total) by mouth every 6 (six) hours as needed.   amLODipine 10 MG tablet Commonly known as: NORVASC TAKE 1 TABLET BY MOUTH DAILY   amoxicillin-clavulanate 875-125 MG tablet Commonly known as: AUGMENTIN Take 1 tablet by mouth every 12 (twelve) hours for 4 days.   ferrous sulfate 325 (65 FE) MG tablet Take 325 mg by mouth daily with breakfast.   MAGNESIUM PO Take 1 tablet by mouth daily.   oxyCODONE 5 MG immediate release  tablet Commonly known as: Oxy IR/ROXICODONE Take 1 tablet (5 mg total) by mouth every 4 (four) hours as needed (pain).   POTASSIUM PO Take 1 tablet by mouth daily.        Follow-up Information     Kinsinger, De Blanch, MD Follow up on 05/06/2023.   Specialty: General Surgery Why: 11:10am, Arrive 30 minutes prior to your appointment time, Please bring your insurance card and photo ID Contact information: [address] [place] [address] [phone]                 Signed: Moise Boring 04/17/2023, 10:26 AM

## 2023-04-19 ENCOUNTER — Telehealth: Payer: Self-pay

## 2023-04-19 LAB — SURGICAL PATHOLOGY

## 2023-04-19 NOTE — Transitions of Care (Post Inpatient/ED Visit) (Signed)
04/19/2023  Name: Katherine Arellano MRN: [id number] DOB: March 17, 1976  Today's TOC FU Call Status: Today's TOC FU Call Status:: Successful TOC FU Call Completed TOC FU Call Complete Date: 04/19/23 (Voicemail message received from pt-return call to pt) Patient's Name and Date of Birth confirmed.  Transition Care Management Follow-up Telephone Call Date of Discharge: 04/17/23 Discharge Facility: [facility]) Type of Discharge: Inpatient Admission Primary Inpatient Discharge Diagnosis:: "acute appendicits,unspecified type" How have you been since you were released from the hospital?: Better (pt voices she is sdoing well-not having a lot of pain-not needed Oxycodone-took some Ibuprofen yest, hasn't taken any today,current pain 1/10, appetite improving, LBM today, surg incision WNL-no s/s of infection) Any questions or concerns?: No  Items Reviewed: Did you receive and understand the discharge instructions provided?: Yes Medications obtained,verified, and reconciled?: Yes (Medications Reviewed) Any new allergies since your discharge?: No Dietary orders reviewed?: Yes Type of Diet Ordered:: low salt/heart healthy Do you have support at home?: Yes People in Home: parent(s) Name of Support/Comfort Primary Source: pt's mom helping her out while she recovers  Medications Reviewed Today: Medications Reviewed Today     Reviewed by Charlyn Minerva, RN (Registered Nurse) on 04/19/23 at 1138  Med List Status: <None>   Medication Order Taking? Sig Documenting Provider Last Dose Status Informant  acetaminophen (TYLENOL) 500 MG tablet [id number] Yes Take 2 tablets (1,000 mg total) by mouth every 6 (six) hours as needed. Moise Boring, MD Taking Active   amLODipine (NORVASC) 10 MG tablet [id number] Yes TAKE 1 TABLET BY MOUTH DAILY Arnette Felts, FNP Taking Active Self, Pharmacy Records  amoxicillin-clavulanate (AUGMENTIN) 875-125 MG tablet [id number] Yes Take 1 tablet by mouth every 12  (twelve) hours for 4 days. Moise Boring, MD Taking Active   ferrous sulfate 325 (65 FE) MG tablet [id number] Yes Take 325 mg by mouth daily with breakfast. [provider] Taking Active Self  ibuprofen (ADVIL) 200 MG tablet [id number] Yes Take 200 mg by mouth every 6 (six) hours as needed for mild pain (pain score 1-3). [provider] Taking Active Self  MAGNESIUM PO [id number] Yes Take 1 tablet by mouth daily. [provider] Taking Active Self  oxyCODONE (OXY IR/ROXICODONE) 5 MG immediate release tablet [id number]  Take 1 tablet (5 mg total) by mouth every 4 (four) hours as needed (pain). Moise Boring, MD  Active   POTASSIUM PO [id number] Yes Take 1 tablet by mouth daily. [provider] Taking Active Self            Home Care and Equipment/Supplies: Were Home Health Services Ordered?: NA Any new equipment or medical supplies ordered?: NA  Functional Questionnaire: Do you need assistance with bathing/showering or dressing?: No Do you need assistance with meal preparation?: No Do you need assistance with eating?: No Do you have difficulty maintaining continence: No Do you need assistance with getting out of bed/getting out of a chair/moving?: No Do you have difficulty managing or taking your medications?: No  Follow up appointments reviewed: PCP Follow-up appointment confirmed?: NA Specialist Hospital Follow-up appointment confirmed?: Yes Date of Specialist follow-up appointment?: 05/06/23 Follow-Up Specialty Provider:: Dr. Cecille Rubin Do you need transportation to your follow-up appointment?: No Do you understand care options if your condition(s) worsen?: Yes-patient verbalized understanding  SDOH Interventions Today    Flowsheet Row Most Recent Value  SDOH Interventions   Food Insecurity Interventions Intervention Not Indicated  Transportation Interventions Intervention Not Indicated      TOC Interventions Today  Flowsheet Row Most Recent Value  TOC Interventions   TOC Interventions Discussed/Reviewed TOC Interventions Discussed, Post discharge activity limitations per provider, S/S of infection, Post op wound/incision care      Interventions Today    Flowsheet Row Most Recent Value  General Interventions   General Interventions Discussed/Reviewed General Interventions Discussed, Doctor Visits, Referral to Nurse  [declined-pt does not feel weekly calls warranted]  Doctor Visits Discussed/Reviewed Doctor Visits Discussed, PCP, Specialist  PCP/Specialist Visits Compliance with follow-up visit  Education Interventions   Education Provided Provided Education  Provided Verbal Education On Other, When to see the doctor, Nutrition  [pain mgmt, bowel mgmt]  Nutrition Interventions   Nutrition Discussed/Reviewed Nutrition Discussed, Fluid intake  Pharmacy Interventions   Pharmacy Dicussed/Reviewed Pharmacy Topics Discussed, Medications and their functions  Advanced Directive Interventions   Advanced Directives Discussed/Reviewed Advanced Directives Discussed       Antionette Fairy, RN,BSN,CCM RN Care Manager Transitions of Care  [place]  Direct Phone: [phone] Toll Free: [phone] Fax: [phone]

## 2023-04-19 NOTE — Plan of Care (Signed)
[facility] Tonsillectomy/Adenoidectomy, Postoperative PEDS care plan entered in error.

## 2023-04-19 NOTE — Transitions of Care (Post Inpatient/ED Visit) (Signed)
   04/19/2023  Name: Katherine Arellano MRN: [id number] DOB: 1975/07/07  Today's TOC FU Call Status: Today's TOC FU Call Status:: Unsuccessful Call (1st Attempt) Unsuccessful Call (1st Attempt) Date: 04/19/23  Attempted to reach the patient regarding the most recent Inpatient/ED visit.  Follow Up Plan: Additional outreach attempts will be made to reach the patient to complete the Transitions of Care (Post Inpatient/ED visit) call.     Antionette Fairy, RN,BSN,CCM RN Care Manager Transitions of Care  [place]  Direct Phone: [phone] Toll Free: [phone] Fax: [phone]

## 2023-07-01 ENCOUNTER — Ambulatory Visit: Payer: 59 | Admitting: Nurse Practitioner

## 2023-07-01 VITALS — BP 122/80 | HR 73 | Temp 98.4°F | Ht 66.0 in | Wt 271.4 lb

## 2023-07-01 DIAGNOSIS — M79661 Pain in right lower leg: Secondary | ICD-10-CM | POA: Diagnosis not present

## 2023-07-01 DIAGNOSIS — I10 Essential (primary) hypertension: Secondary | ICD-10-CM | POA: Diagnosis not present

## 2023-07-01 DIAGNOSIS — E66813 Obesity, class 3: Secondary | ICD-10-CM | POA: Diagnosis not present

## 2023-07-01 DIAGNOSIS — E7800 Pure hypercholesterolemia, unspecified: Secondary | ICD-10-CM

## 2023-07-01 DIAGNOSIS — Z6841 Body Mass Index (BMI) 40.0 and over, adult: Secondary | ICD-10-CM | POA: Insufficient documentation

## 2023-07-01 NOTE — Assessment & Plan Note (Signed)
Chronic, slightly elevated LDL at last visit. Continue focusing on low fat diet.

## 2023-07-01 NOTE — Assessment & Plan Note (Addendum)
 No tenderness on palpation and no problems with ambulation. Will refer to PT at patient request.

## 2023-07-01 NOTE — Progress Notes (Signed)
 LILLETTE Kristeen JINNY Gladis, CMA,acting as a neurosurgeon for Katherine Ada, FNP.,have documented all relevant documentation on the behalf of Katherine Ada, FNP,as directed by  Katherine Ada, FNP while in the presence of Katherine Ada, FNP.  Subjective:  Patient ID: Katherine Arellano , female    DOB: Mar 27, 1976 , 48 y.o.   MRN: [id number]  No chief complaint on file.   HPI  Patient presents today for a bp and chol follow up, Patient reports compliance with medication. Patient denies any chest pain, SOB, or headaches. Patient has no concerns today. Patient reports her mammogram is scheduled for Friday January 10th at [facility] in [place]. She had appendicitis in October and had her appendix removed.   When she starts to exercise her right calf has been feeling week and would like to see PT to get some exercises.      Past Medical History:  Diagnosis Date   Appendicitis 04/15/2023   Class 3 severe obesity due to excess calories without serious comorbidity with body mass index (BMI) of 45.0 to 49.9 in adult [facility]) 12/29/2022   High blood pressure    Perforated appendicitis 04/15/2023     Family History  Problem Relation Age of Onset   High blood pressure Mother    Hypertension Mother    Diabetes Father    High blood pressure Father    Cancer Father        prostate    Colon cancer Neg Hx    Esophageal cancer Neg Hx    Rectal cancer Neg Hx    Stomach cancer Neg Hx    Colon polyps Neg Hx      Current Outpatient Medications:    amLODipine  (NORVASC ) 10 MG tablet, TAKE 1 TABLET BY MOUTH DAILY, Disp: 90 tablet, Rfl: 3   ferrous sulfate 325 (65 FE) MG tablet, Take 325 mg by mouth daily with breakfast., Disp: , Rfl:    MAGNESIUM  PO, Take 1 tablet by mouth daily., Disp: , Rfl:    No Known Allergies   Review of Systems  Constitutional: Negative.   Respiratory: Negative.    Cardiovascular: Negative.   Gastrointestinal: Negative.   Neurological: Negative.   Psychiatric/Behavioral: Negative.        Today's Vitals   07/01/23 0832  BP: 122/80  Pulse: 73  Temp: 98.4 F (36.9 C)  TempSrc: Oral  Weight: 271 lb 6.4 oz (123.1 kg)  Height: 5' 6 (1.676 m)  PainSc: 4   PainLoc: Leg   Body mass index is 43.81 kg/m.  Wt Readings from Last 3 Encounters:  07/01/23 271 lb 6.4 oz (123.1 kg)  04/15/23 275 lb (124.7 kg)  12/29/22 279 lb (126.6 kg)    The 10-year ASCVD risk score (Arnett DK, et al., 2019) is: 2.6%   Values used to calculate the score:     Age: 75 years     Sex: Female     Is Non-Hispanic African American: Yes     Diabetic: No     Tobacco smoker: No     Systolic Blood Pressure: 122 mmHg     Is BP treated: Yes     HDL Cholesterol: 47 mg/dL     Total Cholesterol: 164 mg/dL  Objective:  Physical Exam Vitals reviewed.  Constitutional:      General: She is not in acute distress.    Appearance: Normal appearance. She is well-developed. She is obese.  HENT:     Head: Normocephalic.  Eyes:     Pupils:  Pupils are equal, round, and reactive to light.  Cardiovascular:     Rate and Rhythm: Normal rate and regular rhythm.     Pulses: Normal pulses.     Heart sounds: Normal heart sounds. No murmur heard. Pulmonary:     Effort: Pulmonary effort is normal. No respiratory distress.     Breath sounds: Normal breath sounds. No wheezing.  Musculoskeletal:        General: No swelling or tenderness. Normal range of motion.  Skin:    General: Skin is warm and dry.     Capillary Refill: Capillary refill takes less than 2 seconds.  Neurological:     General: No focal deficit present.     Mental Status: She is alert and oriented to person, place, and time.     Cranial Nerves: No cranial nerve deficit.  Psychiatric:        Mood and Affect: Mood normal.        Behavior: Behavior normal.        Thought Content: Thought content normal.        Judgment: Judgment normal.         Assessment And Plan:  Essential hypertension Assessment & Plan: Blood pressure is stable.  Continue current medications.   Orders: -     Microalbumin / creatinine urine ratio  Elevated cholesterol Assessment & Plan: Chronic, slightly elevated LDL at last visit. Continue focusing on low fat diet.   Orders: -     Lipid panel  Right calf pain Assessment & Plan: No tenderness on palpation and no problems with ambulation. Will refer to PT at patient request.   Orders: -     Ambulatory referral to Physical Therapy  Class 3 severe obesity due to excess calories with body mass index (BMI) of 40.0 to 44.9 in adult, unspecified whether serious comorbidity present [facility])    Return for keep same next. .   Patient was given opportunity to ask questions. Patient verbalized understanding of the plan and was able to repeat key elements of the plan. All questions were answered to their satisfaction.    LILLETTE Katherine Ada, FNP, have reviewed all documentation for this visit. The documentation on 07/01/23 for the exam, diagnosis, procedures, and orders are all accurate and complete.   IF YOU HAVE BEEN REFERRED TO A SPECIALIST, IT MAY TAKE 1-2 WEEKS TO SCHEDULE/PROCESS THE REFERRAL. IF YOU HAVE NOT HEARD FROM US /SPECIALIST IN TWO WEEKS, PLEASE GIVE US  A CALL AT [phone] X [phone].

## 2023-07-01 NOTE — Assessment & Plan Note (Signed)
 Blood pressure is stable. Continue current medications

## 2023-07-06 LAB — HM MAMMOGRAPHY

## 2023-07-09 ENCOUNTER — Encounter: Payer: Self-pay | Admitting: Nurse Practitioner

## 2023-08-27 ENCOUNTER — Other Ambulatory Visit: Payer: Self-pay | Admitting: Nurse Practitioner

## 2023-08-27 DIAGNOSIS — I10 Essential (primary) hypertension: Secondary | ICD-10-CM

## 2023-09-28 ENCOUNTER — Encounter: Payer: Self-pay | Admitting: Nurse Practitioner

## 2023-09-28 ENCOUNTER — Ambulatory Visit: Admitting: Nurse Practitioner

## 2023-09-28 VITALS — BP 130/80 | HR 78 | Temp 98.5°F | Ht 66.0 in | Wt 281.4 lb

## 2023-09-28 DIAGNOSIS — J3489 Other specified disorders of nose and nasal sinuses: Secondary | ICD-10-CM | POA: Diagnosis not present

## 2023-09-28 DIAGNOSIS — H6591 Unspecified nonsuppurative otitis media, right ear: Secondary | ICD-10-CM

## 2023-09-28 DIAGNOSIS — Z6841 Body Mass Index (BMI) 40.0 and over, adult: Secondary | ICD-10-CM

## 2023-09-28 DIAGNOSIS — E66813 Obesity, class 3: Secondary | ICD-10-CM | POA: Diagnosis not present

## 2023-09-28 MED ORDER — MOMETASONE FUROATE 50 MCG/ACT NA SUSP
2.0000 | Freq: Every day | NASAL | 2 refills | Status: DC
Start: 2023-09-28 — End: 2023-12-30

## 2023-09-28 MED ORDER — AMOXICILLIN 875 MG PO TABS
875.0000 mg | ORAL_TABLET | Freq: Two times a day (BID) | ORAL | 0 refills | Status: DC
Start: 2023-09-28 — End: 2023-12-30

## 2023-09-28 NOTE — Progress Notes (Signed)
 [place], CMA,acting as a Neurosurgeon for Susanna Epley, FNP.,have documented all relevant documentation on the behalf of Susanna Epley, FNP,as directed by  Susanna Epley, FNP while in the presence of Susanna Epley, FNP.  Subjective:  Patient ID: Katherine Arellano , female    DOB: 11/28/1975 , 48 y.o.   MRN: [id number]  Chief Complaint  Patient presents with   Ear Pain    HPI  Patient presents today for right ear pain. The pain is better now compared to when it initially started. Patient reports it started about a week ago but it is now getting better. She reports she had a bad cold before the pain. She reports she is still having sinus pressure from being sick. She has not taken any medications to include nasal sprays.  She feels stopped up.      Past Medical History:  Diagnosis Date   Appendicitis 04/15/2023   Class 3 severe obesity due to excess calories without serious comorbidity with body mass index (BMI) of 45.0 to 49.9 in adult [facility]) 12/29/2022   High blood pressure    Perforated appendicitis 04/15/2023     Family History  Problem Relation Age of Onset   High blood pressure Mother    Hypertension Mother    Diabetes Father    High blood pressure Father    Cancer Father        prostate    Colon cancer Neg Hx    Esophageal cancer Neg Hx    Rectal cancer Neg Hx    Stomach cancer Neg Hx    Colon polyps Neg Hx      Current Outpatient Medications:    amLODipine (NORVASC) 10 MG tablet, TAKE 1 TABLET BY MOUTH DAILY, Disp: 90 tablet, Rfl: 3   amoxicillin (AMOXIL) 875 MG tablet, Take 1 tablet (875 mg total) by mouth 2 (two) times daily., Disp: 14 tablet, Rfl: 0   ferrous sulfate 325 (65 FE) MG tablet, Take 325 mg by mouth daily with breakfast., Disp: , Rfl:    mometasone (NASONEX) 50 MCG/ACT nasal spray, Place 2 sprays into the nose daily., Disp: 1 each, Rfl: 2   Multiple Vitamins-Minerals (CENTRUM MINIS WOMEN 50+ PO), , Disp: , Rfl:    Vitamin D-Vitamin K (D3 + K2 DOTS) 1000-90  UNIT-MCG TABS, , Disp: , Rfl:    No Known Allergies   Review of Systems  Constitutional: Negative.   HENT:  Positive for ear pain (right ear) and sinus pain.   Respiratory: Negative.    Cardiovascular: Negative.      Today's Vitals   09/28/23 0955  BP: 130/80  Pulse: 78  Temp: 98.5 F (36.9 C)  TempSrc: Oral  Weight: 281 lb 6.4 oz (127.6 kg)  Height: 5\' 6"  (1.676 m)  PainSc: 1   PainLoc: Ear   Body mass index is 45.42 kg/m.  Wt Readings from Last 3 Encounters:  09/28/23 281 lb 6.4 oz (127.6 kg)  07/01/23 271 lb 6.4 oz (123.1 kg)  04/15/23 275 lb (124.7 kg)     Objective:  Physical Exam Vitals reviewed.  Constitutional:      General: She is not in acute distress.    Appearance: Normal appearance. She is obese.  HENT:     Right Ear: External ear normal. There is no impacted cerumen. Tympanic membrane is erythematous and bulging.     Left Ear: Tympanic membrane, ear canal and external ear normal. There is no impacted cerumen.     Nose: Congestion  present.     Right Turbinates: Swollen.  Pulmonary:     Effort: Pulmonary effort is normal. No respiratory distress.  Neurological:     Mental Status: She is alert.         Assessment And Plan:  Right non-suppurative otitis media Assessment & Plan: Erythema to ear canal, will treat with antibiotics  Orders: -     Amoxicillin; Take 1 tablet (875 mg total) by mouth 2 (two) times daily.  Dispense: 14 tablet; Refill: 0  Sinus pressure Assessment & Plan: Frontal sinus pressure, will treat for sinus infection  Orders: -     Mometasone Furoate; Place 2 sprays into the nose daily.  Dispense: 1 each; Refill: 2  Class 3 severe obesity due to excess calories without serious comorbidity with body mass index (BMI) of 45.0 to 49.9 in adult [facility]) Assessment & Plan: She is encouraged to strive for BMI less than 30 to decrease cardiac risk. Advised to aim for at least 150 minutes of exercise per week.      Return if  symptoms worsen or fail to improve.  Patient was given opportunity to ask questions. Patient verbalized understanding of the plan and was able to repeat key elements of the plan. All questions were answered to their satisfaction.    Inge Mangle, FNP, have reviewed all documentation for this visit. The documentation on 09/28/23 for the exam, diagnosis, procedures, and orders are all accurate and complete.   IF YOU HAVE BEEN REFERRED TO A SPECIALIST, IT MAY TAKE 1-2 WEEKS TO SCHEDULE/PROCESS THE REFERRAL. IF YOU HAVE NOT HEARD FROM US /SPECIALIST IN TWO WEEKS, PLEASE GIVE US  A CALL AT [phone] X [phone].

## 2023-10-11 DIAGNOSIS — J3489 Other specified disorders of nose and nasal sinuses: Secondary | ICD-10-CM | POA: Insufficient documentation

## 2023-10-11 DIAGNOSIS — H6591 Unspecified nonsuppurative otitis media, right ear: Secondary | ICD-10-CM | POA: Insufficient documentation

## 2023-10-11 NOTE — Assessment & Plan Note (Signed)
 Erythema to ear canal, will treat with antibiotics

## 2023-10-11 NOTE — Assessment & Plan Note (Signed)
 She is encouraged to strive for BMI less than 30 to decrease cardiac risk. Advised to aim for at least 150 minutes of exercise per week.

## 2023-10-11 NOTE — Assessment & Plan Note (Signed)
 Frontal sinus pressure, will treat for sinus infection

## 2023-12-30 ENCOUNTER — Encounter: Payer: Self-pay | Admitting: Nurse Practitioner

## 2023-12-30 ENCOUNTER — Ambulatory Visit: Payer: Self-pay | Admitting: Nurse Practitioner

## 2023-12-30 VITALS — BP 130/70 | HR 89 | Temp 99.1°F | Ht 66.0 in | Wt 281.0 lb

## 2023-12-30 DIAGNOSIS — E66813 Obesity, class 3: Secondary | ICD-10-CM

## 2023-12-30 DIAGNOSIS — Z6841 Body Mass Index (BMI) 40.0 and over, adult: Secondary | ICD-10-CM

## 2023-12-30 DIAGNOSIS — E7800 Pure hypercholesterolemia, unspecified: Secondary | ICD-10-CM | POA: Diagnosis not present

## 2023-12-30 DIAGNOSIS — D500 Iron deficiency anemia secondary to blood loss (chronic): Secondary | ICD-10-CM | POA: Diagnosis not present

## 2023-12-30 DIAGNOSIS — R7309 Other abnormal glucose: Secondary | ICD-10-CM

## 2023-12-30 DIAGNOSIS — I10 Essential (primary) hypertension: Secondary | ICD-10-CM | POA: Diagnosis not present

## 2023-12-30 DIAGNOSIS — Z0000 Encounter for general adult medical examination without abnormal findings: Secondary | ICD-10-CM | POA: Diagnosis not present

## 2023-12-30 DIAGNOSIS — Z79899 Other long term (current) drug therapy: Secondary | ICD-10-CM

## 2023-12-30 DIAGNOSIS — Z139 Encounter for screening, unspecified: Secondary | ICD-10-CM

## 2023-12-30 LAB — POCT URINALYSIS DIP (CLINITEK)
Bilirubin, UA: NEGATIVE
Blood, UA: NEGATIVE
Glucose, UA: NEGATIVE mg/dL
Ketones, POC UA: NEGATIVE mg/dL
Leukocytes, UA: NEGATIVE
Nitrite, UA: NEGATIVE
POC PROTEIN,UA: NEGATIVE
Spec Grav, UA: >=1.03 — AB (ref 1.010–1.025)
Urobilinogen, UA: 0.2 U/dL
pH, UA: 6 (ref 5.0–8.0)

## 2023-12-30 NOTE — Progress Notes (Signed)
 LILLETTE Kristeen JINNY Gladis, CMA,acting as a Neurosurgeon for Gaines Ada, FNP.,have documented all relevant documentation on the behalf of Gaines Ada, FNP,as directed by  Gaines Ada, FNP while in the presence of Gaines Ada, FNP.  Subjective:    Patient ID: Katherine Arellano , female    DOB: [birth year] , 48 y.o.   MRN: [id number]  Chief Complaint  Patient presents with   Annual Exam    Patient presents today for HM, Patient reports compliance with medication. Patient denies any chest pain, SOB, or headaches. Patient has no concerns today.     HPI  Patient presents for annual exam.    Increased fiber and protein in diet and cooking more meals.  Drinks 64 oz per day of water.  She walks 30 minutes a day 5 days a week, 2 days HIIT, Sunday 3-5 mile walk.  Finished PT in April of this year.   She is working on stress level, increased exercise and better nutrition has helped.  Sleeps well, taking magnesium , sleeps 8 hours at a time.    History of hysterectomy, ovaries in place.       Past Medical History:  Diagnosis Date   Appendicitis 04/15/2023   Class 3 severe obesity due to excess calories without serious comorbidity with body mass index (BMI) of 45.0 to 49.9 in adult 12/29/2022   High blood pressure    Perforated appendicitis 04/15/2023     Family History  Problem Relation Age of Onset   High blood pressure Mother    Hypertension Mother    Diabetes Father    High blood pressure Father    Cancer Father        prostate    Colon cancer Neg Hx    Esophageal cancer Neg Hx    Rectal cancer Neg Hx    Stomach cancer Neg Hx    Colon polyps Neg Hx      Current Outpatient Medications:    amLODipine  (NORVASC ) 10 MG tablet, TAKE 1 TABLET BY MOUTH DAILY, Disp: 90 tablet, Rfl: 3   ferrous sulfate 325 (65 FE) MG tablet, Take 325 mg by mouth daily with breakfast., Disp: , Rfl:    Multiple Vitamins-Minerals (CENTRUM MINIS WOMEN 50+ PO), , Disp: , Rfl:    Vitamin D -Vitamin K (D3 + K2 DOTS) 1000-90  UNIT-MCG TABS, , Disp: , Rfl:    No Known Allergies    . The patient's tobacco use is:  Social History   Tobacco Use  Smoking Status Never  Smokeless Tobacco Never  . She has been exposed to passive smoke. The patient's alcohol use is:  Social History   Substance and Sexual Activity  Alcohol Use Not Currently   Comment: More like once a month      Review of Systems  Constitutional:  Negative for chills, fatigue and fever.  HENT:  Negative for congestion, dental problem, sinus pressure, sneezing and sore throat.   Respiratory:  Negative for cough and shortness of breath.   Cardiovascular:  Negative for chest pain, palpitations and leg swelling.  Gastrointestinal:  Negative for constipation, diarrhea, nausea and vomiting.  Endocrine: Negative for polydipsia, polyphagia and polyuria.  Genitourinary:  Negative for dysuria, urgency, vaginal bleeding and vaginal discharge.  Musculoskeletal:  Negative for arthralgias, joint swelling and myalgias.  Skin:  Negative for rash and wound.  Neurological:  Negative for dizziness, weakness and headaches.  Psychiatric/Behavioral:  Negative for dysphoric mood. The patient is not nervous/anxious.      Today's Vitals  12/30/23 0846  BP: 130/70  Pulse: 89  Temp: 99.1 F (37.3 C)  TempSrc: Oral  Weight: 281 lb (127.5 kg)  Height: 5' 6 (1.676 m)  PainSc: 0-No pain   Body mass index is 45.35 kg/m.  Wt Readings from Last 3 Encounters:  12/30/23 281 lb (127.5 kg)  09/28/23 281 lb 6.4 oz (127.6 kg)  07/01/23 271 lb 6.4 oz (123.1 kg)     Objective:  Physical Exam Exam conducted with a chaperone present.  Constitutional:      General: She is not in acute distress.    Appearance: She is obese.  HENT:     Right Ear: External ear normal.     Left Ear: External ear normal.     Nose: Nose normal.     Mouth/Throat:     Mouth: Mucous membranes are moist.     Pharynx: Oropharynx is clear.  Eyes:     Pupils: Pupils are equal, round, and  reactive to light.  Neck:     Vascular: No carotid bruit.  Cardiovascular:     Rate and Rhythm: Normal rate and regular rhythm.     Pulses: Normal pulses.     Heart sounds: Normal heart sounds.  Pulmonary:     Effort: Pulmonary effort is normal. No respiratory distress.     Breath sounds: Normal breath sounds.  Genitourinary:    General: Normal vulva.     Exam position: Lithotomy position.     Pubic Area: No rash.      Tanner stage (genital): 5.     Labia:        Right: No rash or lesion.        Left: No rash or lesion.      Urethra: No urethral swelling or urethral lesion.     Vagina: Vaginal discharge (thin,white, small amount) present. No lesions.     Adnexa: Right adnexa normal and left adnexa normal.       Right: No mass or tenderness.         Left: No mass or tenderness.       Rectum: Guaiac result negative. No mass, external hemorrhoid or internal hemorrhoid.  Musculoskeletal:        General: Normal range of motion.     Cervical back: Normal range of motion.     Right lower leg: No edema.     Left lower leg: No edema.  Lymphadenopathy:     Cervical: No cervical adenopathy.     Lower Body: No right inguinal adenopathy. No left inguinal adenopathy.  Skin:    General: Skin is warm and dry.     Capillary Refill: Capillary refill takes less than 2 seconds.     Findings: No lesion or rash.  Neurological:     General: No focal deficit present.     Mental Status: She is alert and oriented to person, place, and time.     Motor: No weakness.     Coordination: Coordination normal.  Psychiatric:        Mood and Affect: Mood normal.        Behavior: Behavior normal.        Thought Content: Thought content normal.        Judgment: Judgment normal.         Assessment And Plan:     Encounter for annual health examination Assessment & Plan: Behavior modifications discussed and diet history reviewed.   Pt will continue to exercise regularly and modify diet  with low GI,  plant based foods and decrease intake of processed foods.  Recommend intake of daily multivitamin, Vitamin D , and calcium.  Recommend mammogram and colonoscopy for preventive screenings, as well as recommend immunizations that include influenza, TDAP, and Shingles    Essential hypertension Assessment & Plan: EKG performed, NSR 86.  BP controlled on current regime.    Orders: -     EKG 12-Lead -     POCT URINALYSIS DIP (CLINITEK) -     Microalbumin / creatinine urine ratio -     CMP14+EGFR  Elevated cholesterol Assessment & Plan: Chronic, slightly elevated LDL at last visit. Continue focusing on low fat diet.   Orders: -     Lipid panel  Iron deficiency anemia due to chronic blood loss -     Iron, TIBC and Ferritin Panel  Class 3 severe obesity due to excess calories without serious comorbidity with body mass index (BMI) of 45.0 to 49.9 in adult Assessment & Plan: Patient currently exercising 150 minutes per week, eating a high fiber, high protein diet with adequate water intake.     Other long term (current) drug therapy -     CBC with Differential/Platelet  Abnormal glucose Assessment & Plan: Hgb A1C rechecked.   Orders: -     Hemoglobin A1c  Encounter for screening -     Hepatitis B surface antibody,qualitative  Annual physical exam Assessment & Plan: Pelvic exam performed today, health maintenance points individualized and discussed.  Colonoscopy in 2032.       Return for 1 year physical, 6 month bp check. Patient was given opportunity to ask questions. Patient verbalized understanding of the plan and was able to repeat key elements of the plan. All questions were answered to their satisfaction.   Gaines Ada, FNP  I, Gaines Ada, FNP, have reviewed all documentation for this visit. The documentation on 12/30/23 for the exam, diagnosis, procedures, and orders are all accurate and complete.

## 2023-12-30 NOTE — Assessment & Plan Note (Signed)
 EKG performed, NSR 86.  BP controlled on current regime.

## 2023-12-30 NOTE — Assessment & Plan Note (Signed)
 Pelvic exam performed today, health maintenance points individualized and discussed.  Colonoscopy in 2032.

## 2023-12-30 NOTE — Assessment & Plan Note (Signed)
 Patient currently exercising 150 minutes per week, eating a high fiber, high protein diet with adequate water intake.

## 2023-12-30 NOTE — Assessment & Plan Note (Signed)
 Hgb A1C rechecked.

## 2023-12-31 LAB — HEPATITIS B SURFACE ANTIBODY,QUALITATIVE: Hep B Surface Ab, Qual: REACTIVE

## 2024-01-01 LAB — CMP14+EGFR
ALT: 7 IU/L (ref 0–32)
AST: 15 IU/L (ref 0–40)
Albumin: 4.2 g/dL (ref 3.9–4.9)
Alkaline Phosphatase: 57 IU/L (ref 44–121)
BUN/Creatinine Ratio: 11 (ref 9–23)
BUN: 12 mg/dL (ref 6–24)
Bilirubin Total: 0.5 mg/dL (ref 0.0–1.2)
CO2: 22 mmol/L (ref 20–29)
Calcium: 9 mg/dL (ref 8.7–10.2)
Chloride: 103 mmol/L (ref 96–106)
Creatinine, Ser: 1.07 mg/dL — ABNORMAL HIGH (ref 0.57–1.00)
Globulin, Total: 2.6 g/dL (ref 1.5–4.5)
Glucose: 83 mg/dL (ref 70–99)
Potassium: 4.4 mmol/L (ref 3.5–5.2)
Sodium: 138 mmol/L (ref 134–144)
Total Protein: 6.8 g/dL (ref 6.0–8.5)
eGFR: 64 mL/min/1.73 (ref >59)

## 2024-01-01 LAB — CBC WITH DIFFERENTIAL/PLATELET
Basophils Absolute: 0.1 x10E3/uL (ref 0.0–0.2)
Basos: 1 %
EOS (ABSOLUTE): 0.1 x10E3/uL (ref 0.0–0.4)
Eos: 1 %
Hematocrit: 39.6 % (ref 34.0–46.6)
Hemoglobin: 12.7 g/dL (ref 11.1–15.9)
Immature Grans (Abs): 0 x10E3/uL (ref 0.0–0.1)
Immature Granulocytes: 0 %
Lymphocytes Absolute: 1.7 x10E3/uL (ref 0.7–3.1)
Lymphs: 30 %
MCH: 29.7 pg (ref 26.6–33.0)
MCHC: 32.1 g/dL (ref 31.5–35.7)
MCV: 93 fL (ref 79–97)
Monocytes Absolute: 0.5 x10E3/uL (ref 0.1–0.9)
Monocytes: 8 %
Neutrophils Absolute: 3.4 x10E3/uL (ref 1.4–7.0)
Neutrophils: 60 %
Platelets: 385 x10E3/uL (ref 150–450)
RBC: 4.27 x10E6/uL (ref 3.77–5.28)
RDW: 12.2 % (ref 11.7–15.4)
WBC: 5.8 x10E3/uL (ref 3.4–10.8)

## 2024-01-01 LAB — IRON,TIBC AND FERRITIN PANEL
Ferritin: 47 ng/mL (ref 15–150)
Iron Saturation: 24 % (ref 15–55)
Iron: 72 ug/dL (ref 27–159)
Total Iron Binding Capacity: 302 ug/dL (ref 250–450)
UIBC: 230 ug/dL (ref 131–425)

## 2024-01-01 LAB — LIPID PANEL
Chol/HDL Ratio: 3.9 ratio (ref 0.0–4.4)
Cholesterol, Total: 173 mg/dL (ref 100–199)
HDL: 44 mg/dL (ref >39)
LDL Chol Calc (NIH): 119 mg/dL — ABNORMAL HIGH (ref 0–99)
Triglycerides: 52 mg/dL (ref 0–149)
VLDL Cholesterol Cal: 10 mg/dL (ref 5–40)

## 2024-01-01 LAB — MICROALBUMIN / CREATININE URINE RATIO
Creatinine, Urine: 219.1 mg/dL
Microalb/Creat Ratio: 4 mg/g{creat} (ref 0–29)
Microalbumin, Urine: 8.8 ug/mL

## 2024-01-01 LAB — HEMOGLOBIN A1C
Est. average glucose Bld gHb Est-mCnc: 91 mg/dL
Hgb A1c MFr Bld: 4.8 % (ref 4.8–5.6)

## 2024-01-10 ENCOUNTER — Ambulatory Visit: Payer: Self-pay | Admitting: Nurse Practitioner

## 2024-01-10 DIAGNOSIS — Z0000 Encounter for general adult medical examination without abnormal findings: Secondary | ICD-10-CM | POA: Insufficient documentation

## 2024-01-10 NOTE — Assessment & Plan Note (Signed)

## 2024-01-10 NOTE — Assessment & Plan Note (Signed)
Chronic, slightly elevated LDL at last visit. Continue focusing on low fat diet.

## 2024-05-25 ENCOUNTER — Other Ambulatory Visit: Payer: Self-pay | Admitting: Nurse Practitioner

## 2024-05-25 DIAGNOSIS — I10 Essential (primary) hypertension: Secondary | ICD-10-CM

## 2024-07-03 ENCOUNTER — Encounter: Payer: Self-pay | Admitting: Nurse Practitioner

## 2024-07-03 ENCOUNTER — Telehealth (INDEPENDENT_AMBULATORY_CARE_PROVIDER_SITE_OTHER): Admitting: Nurse Practitioner

## 2024-07-03 DIAGNOSIS — R0981 Nasal congestion: Secondary | ICD-10-CM | POA: Insufficient documentation

## 2024-07-03 DIAGNOSIS — Z79899 Other long term (current) drug therapy: Secondary | ICD-10-CM

## 2024-07-03 DIAGNOSIS — Z6841 Body Mass Index (BMI) 40.0 and over, adult: Secondary | ICD-10-CM | POA: Diagnosis not present

## 2024-07-03 DIAGNOSIS — D500 Iron deficiency anemia secondary to blood loss (chronic): Secondary | ICD-10-CM | POA: Diagnosis not present

## 2024-07-03 DIAGNOSIS — I10 Essential (primary) hypertension: Secondary | ICD-10-CM

## 2024-07-03 DIAGNOSIS — R7309 Other abnormal glucose: Secondary | ICD-10-CM

## 2024-07-03 DIAGNOSIS — E7800 Pure hypercholesterolemia, unspecified: Secondary | ICD-10-CM | POA: Diagnosis not present

## 2024-07-03 MED ORDER — MOMETASONE FUROATE 50 MCG/ACT NA SUSP: 2.0000 | Freq: Every day | NASAL | 5 refills | Status: AC

## 2024-07-03 NOTE — Assessment & Plan Note (Signed)
 Levels have been normal the last two times checked, will not check this visit. Will check Hgb if low then will add iron levels.

## 2024-07-03 NOTE — Progress Notes (Signed)
 "  Virtual Visit via Video Note  I,Jameka J Llittleton, CMA,acting as a scribe for Gaines Ada, FNP.,have documented all relevant documentation on the behalf of Gaines Ada, FNP,as directed by  Gaines Ada, FNP while in the presence of Gaines Ada, FNP.  I connected with Katherine Arellano on 07/03/2024 at 11:40 AM EST by a video enabled telemedicine application and verified that I am speaking with the correct person using two identifiers.  Patient Location: Home Provider Location: Home Office  I discussed the limitations, risks, security, and privacy concerns of performing an evaluation and management service by video and the availability of in person appointments. I also discussed with the patient that there may be a patient responsible charge related to this service. The patient expressed understanding and agreed to proceed.  Subjective: PCP: Ada Gaines, FNP  Chief Complaint  Patient presents with   Hypertension    Patient presents today for a bpc. Patient reports compliance with her meds. Patient denies having any chest pain, sob or headaches at this time. Patient is concerned with her sinuses she wants to know if she should see a ENT.    Virtual visit for hypertension f/u and would like referral to ENT for congestion that causes her to breath funny per her parents.   She reports having a change in her breathing, sounds like she is stuffy but is not having any issues. She had  her tonsils removed in her 20's. She reports she snores, has not had a sleep apnea test. Does not take naps during the day when sitting still, denies fatigue. She had problems with allergies when she was younger but has tapered off while in college.      ROS: Per HPI Current Medications[1]  Observations/Objective: Today's Vitals   Physical Exam Vitals reviewed.  Constitutional:      General: She is not in acute distress.    Appearance: Normal appearance. She is obese.  Pulmonary:     Effort:  Pulmonary effort is normal. No respiratory distress.  Skin:    Capillary Refill: Capillary refill takes less than 2 seconds.  Neurological:     General: No focal deficit present.     Mental Status: She is alert and oriented to person, place, and time.     Cranial Nerves: No cranial nerve deficit.  Psychiatric:        Mood and Affect: Mood normal.        Behavior: Behavior normal.        Thought Content: Thought content normal.        Judgment: Judgment normal.     Assessment and Plan: Essential hypertension Assessment & Plan: She has a blood pressure cuff at home but does not know how to use, will come in next week to have education. Otherwise has been doing well and does not need refills.   Orders: -     CMP14+EGFR; Future -     Microalbumin / creatinine urine ratio; Future  Elevated cholesterol Assessment & Plan: Chronic, slightly elevated LDL at last visit. Continue focusing on low fat diet. Will come for labs next week.  Orders: -     CMP14+EGFR; Future -     Lipid panel; Future  Abnormal glucose Assessment & Plan: Hgb A1C normal last several times checked, will not recheck this visit.    Nasal congestion Assessment & Plan: Rx for nasonex  sent and referral to ENT at patient request. Has had her tonsils removed in her 20's. She may have  enlarged turbinates and the nasonex  may be effective.   Orders: -     Ambulatory referral to ENT -     Mometasone  Furoate; Place 2 sprays into the nose daily.  Dispense: 1 each; Refill: 5  Iron deficiency anemia due to chronic blood loss Assessment & Plan: Levels have been normal the last two times checked, will not check this visit. Will check Hgb if low then will add iron levels.    Morbid obesity with BMI of 45.0-49.9, adult (HCC)  Other long term (current) drug therapy -     CBC; Future    Follow Up Instructions: Return in about 3 months (around 10/01/2024) for bpc.   I discussed the assessment and treatment plan with  the patient. The patient was provided an opportunity to ask questions, and all were answered. The patient agreed with the plan and demonstrated an understanding of the instructions.   The patient was advised to call back or seek an in-person evaluation if the symptoms worsen or if the condition fails to improve as anticipated.  The above assessment and management plan was discussed with the patient. The patient verbalized understanding of and has agreed to the management plan.   LILLETTE Gaines Ada, FNP, have reviewed all documentation for this visit. The documentation on 07/03/2024 for the exam, diagnosis, procedures, and orders are all accurate and complete.      [1]  Current Outpatient Medications:    amLODipine  (NORVASC ) 10 MG tablet, TAKE 1 TABLET BY MOUTH DAILY, Disp: 90 tablet, Rfl: 3   ferrous sulfate 325 (65 FE) MG tablet, Take 325 mg by mouth daily with breakfast., Disp: , Rfl:    mometasone  (NASONEX ) 50 MCG/ACT nasal spray, Place 2 sprays into the nose daily., Disp: 1 each, Rfl: 5   Multiple Vitamins-Minerals (CENTRUM MINIS WOMEN 50+ PO), , Disp: , Rfl:    Vitamin D -Vitamin K (D3 + K2 DOTS) 1000-90 UNIT-MCG TABS, , Disp: , Rfl:   "

## 2024-07-03 NOTE — Assessment & Plan Note (Signed)
 She has a blood pressure cuff at home but does not know how to use, will come in next week to have education. Otherwise has been doing well and does not need refills.

## 2024-07-03 NOTE — Patient Instructions (Signed)
 Hypertension, Adult Hypertension is another name for high blood pressure. High blood pressure forces your heart to work harder to pump blood. This can cause problems over time. There are two numbers in a blood pressure reading. There is a top number (systolic) over a bottom number (diastolic). It is best to have a blood pressure that is below 120/80. What are the causes? The cause of this condition is not known. Some other conditions can lead to high blood pressure. What increases the risk? Some lifestyle factors can make you more likely to develop high blood pressure: Smoking. Not getting enough exercise or physical activity. Being overweight. Having too much fat, sugar, calories, or salt (sodium) in your diet. Drinking too much alcohol. Other risk factors include: Having any of these conditions: Heart disease. Diabetes. High cholesterol. Kidney disease. Obstructive sleep apnea. Having a family history of high blood pressure and high cholesterol. Age. The risk increases with age. Stress. What are the signs or symptoms? High blood pressure may not cause symptoms. Very high blood pressure (hypertensive crisis) may cause: Headache. Fast or uneven heartbeats (palpitations). Shortness of breath. Nosebleed. Vomiting or feeling like you may vomit (nauseous). Changes in how you see. Very bad chest pain. Feeling dizzy. Seizures. How is this treated? This condition is treated by making healthy lifestyle changes, such as: Eating healthy foods. Exercising more. Drinking less alcohol. Your doctor may prescribe medicine if lifestyle changes do not help enough and if: Your top number is above 130. Your bottom number is above 80. Your personal target blood pressure may vary. Follow these instructions at home: Eating and drinking  If told, follow the DASH eating plan. To follow this plan: Fill one half of your plate at each meal with fruits and vegetables. Fill one fourth of your plate  at each meal with whole grains. Whole grains include whole-wheat pasta, brown rice, and whole-grain bread. Eat or drink low-fat dairy products, such as skim milk or low-fat yogurt. Fill one fourth of your plate at each meal with low-fat (lean) proteins. Low-fat proteins include fish, chicken without skin, eggs, beans, and tofu. Avoid fatty meat, cured and processed meat, or chicken with skin. Avoid pre-made or processed food. Limit the amount of salt in your diet to less than 1,500 mg each day. Do not drink alcohol if: Your doctor tells you not to drink. You are pregnant, may be pregnant, or are planning to become pregnant. If you drink alcohol: Limit how much you have to: 0-1 drink a day for women. 0-2 drinks a day for men. Know how much alcohol is in your drink. In the [place] one drink equals one 12 oz bottle of beer (355 mL), one 5 oz glass of wine (148 mL), or one 1 oz glass of hard liquor (44 mL). Lifestyle  Work with your doctor to stay at a healthy weight or to lose weight. Ask your doctor what the best weight is for you. Get at least 30 minutes of exercise that causes your heart to beat faster (aerobic exercise) most days of the week. This may include walking, swimming, or biking. Get at least 30 minutes of exercise that strengthens your muscles (resistance exercise) at least 3 days a week. This may include lifting weights or doing Pilates. Do not smoke or use any products that contain nicotine or tobacco. If you need help quitting, ask your doctor. Check your blood pressure at home as told by your doctor. Keep all follow-up visits. Medicines Take over-the-counter and prescription medicines  only as told by your doctor. Follow directions carefully. Do not skip doses of blood pressure medicine. The medicine does not work as well if you skip doses. Skipping doses also puts you at risk for problems. Ask your doctor about side effects or reactions to medicines that you should watch  for. Contact a doctor if: You think you are having a reaction to the medicine you are taking. You have headaches that keep coming back. You feel dizzy. You have swelling in your ankles. You have trouble with your vision. Get help right away if: You get a very bad headache. You start to feel mixed up (confused). You feel weak or numb. You feel faint. You have very bad pain in your: Chest. Belly (abdomen). You vomit more than once. You have trouble breathing. These symptoms may be an emergency. Get help right away. Call 911. Do not wait to see if the symptoms will go away. Do not drive yourself to the hospital. Summary Hypertension is another name for high blood pressure. High blood pressure forces your heart to work harder to pump blood. For most people, a normal blood pressure is less than 120/80. Making healthy choices can help lower blood pressure. If your blood pressure does not get lower with healthy choices, you may need to take medicine. This information is not intended to replace advice given to you by your health care provider. Make sure you discuss any questions you have with your health care provider. Document Revised: 04/03/2021 Document Reviewed: 04/03/2021 Elsevier Patient Education  2024 [employer].

## 2024-07-03 NOTE — Assessment & Plan Note (Signed)
 Rx for nasonex  sent and referral to ENT at patient request. Has had her tonsils removed in her 20's. She may have enlarged turbinates and the nasonex  may be effective.

## 2024-07-03 NOTE — Assessment & Plan Note (Addendum)
 Chronic, slightly elevated LDL at last visit. Continue focusing on low fat diet. Will come for labs next week.

## 2024-07-03 NOTE — Assessment & Plan Note (Addendum)
 Hgb A1C normal last several times checked, will not recheck this visit.

## 2024-07-04 ENCOUNTER — Ambulatory Visit: Payer: Self-pay | Admitting: Nurse Practitioner

## 2024-07-04 ENCOUNTER — Ambulatory Visit: Payer: Self-pay

## 2024-07-07 LAB — HM MAMMOGRAPHY

## 2024-07-12 ENCOUNTER — Encounter: Payer: Self-pay | Admitting: Nurse Practitioner

## 2025-01-01 ENCOUNTER — Encounter: Payer: Self-pay | Admitting: Nurse Practitioner
